# Patient Record
Sex: Female | Born: 1960 | Race: White | Hispanic: No | Marital: Married | Smoking: Never smoker
Health system: Southern US, Community
[De-identification: ages and names within clinical notes are randomized; demographics above are authoritative.]

## PROBLEM LIST (undated history)

## (undated) DIAGNOSIS — E119 Type 2 diabetes mellitus without complications: Secondary | ICD-10-CM

## (undated) DIAGNOSIS — U071 COVID-19: Secondary | ICD-10-CM

## (undated) DIAGNOSIS — I10 Essential (primary) hypertension: Secondary | ICD-10-CM

## (undated) HISTORY — PX: CHOLECYSTECTOMY: SHX55

## (undated) HISTORY — PX: CATARACT EXTRACTION: SUR2

## (undated) HISTORY — PX: DILATION AND CURETTAGE OF UTERUS: SHX78

## (undated) HISTORY — PX: TUBAL LIGATION: SHX77

## (undated) HISTORY — PX: UTERINE FIBROID EMBOLIZATION: SHX825

---

## 2003-12-21 ENCOUNTER — Other Ambulatory Visit: Payer: Self-pay

## 2007-08-12 ENCOUNTER — Emergency Department: Payer: Self-pay | Admitting: Emergency Medicine

## 2008-03-29 ENCOUNTER — Ambulatory Visit: Payer: Self-pay | Admitting: Obstetrics & Gynecology

## 2008-04-01 ENCOUNTER — Ambulatory Visit: Payer: Self-pay | Admitting: Obstetrics & Gynecology

## 2012-08-20 LAB — COMPREHENSIVE METABOLIC PANEL
Anion Gap: 8 (ref 7–16)
Calcium, Total: 9.2 mg/dL (ref 8.5–10.1)
Chloride: 107 mmol/L (ref 98–107)
Co2: 27 mmol/L (ref 21–32)
EGFR (African American): >60
EGFR (Non-African Amer.): >60
Osmolality: 288 (ref 275–301)
Potassium: 4 mmol/L (ref 3.5–5.1)
SGOT(AST): 28 U/L (ref 15–37)
SGPT (ALT): 42 U/L (ref 12–78)
Sodium: 142 mmol/L (ref 136–145)
Total Protein: 7.6 g/dL (ref 6.4–8.2)

## 2012-08-20 LAB — CBC
HCT: 40.9 % (ref 35.0–47.0)
HGB: 13.9 g/dL (ref 12.0–16.0)
MCH: 30.2 pg (ref 26.0–34.0)
MCHC: 33.9 g/dL (ref 32.0–36.0)
MCV: 89 fL (ref 80–100)
Platelet: 213 10*3/uL (ref 150–440)

## 2012-08-20 LAB — LIPASE, BLOOD: Lipase: 123 U/L (ref 73–393)

## 2012-08-21 ENCOUNTER — Observation Stay: Payer: Self-pay | Admitting: Surgery

## 2012-08-21 LAB — URINALYSIS, COMPLETE
Bilirubin,UR: NEGATIVE
Blood: NEGATIVE
Glucose,UR: NEGATIVE mg/dL (ref 0–75)
Ketone: NEGATIVE
Leukocyte Esterase: NEGATIVE
Nitrite: NEGATIVE
Ph: 6 (ref 4.5–8.0)
RBC,UR: NONE SEEN /HPF (ref 0–5)
Specific Gravity: 1.004 (ref 1.003–1.030)
Squamous Epithelial: NONE SEEN
WBC UR: <1 /HPF (ref 0–5)

## 2012-08-21 LAB — PREGNANCY, URINE: Pregnancy Test, Urine: NEGATIVE m[IU]/mL

## 2012-09-08 ENCOUNTER — Other Ambulatory Visit: Payer: Self-pay | Admitting: Surgery

## 2012-09-08 LAB — BASIC METABOLIC PANEL
BUN: 19 mg/dL — ABNORMAL HIGH (ref 7–18)
Chloride: 108 mmol/L — ABNORMAL HIGH (ref 98–107)
Co2: 27 mmol/L (ref 21–32)
Creatinine: 0.92 mg/dL (ref 0.60–1.30)
EGFR (African American): >60
Glucose: 148 mg/dL — ABNORMAL HIGH (ref 65–99)
Osmolality: 284 (ref 275–301)
Sodium: 140 mmol/L (ref 136–145)

## 2012-09-08 LAB — CBC WITH DIFFERENTIAL/PLATELET
Basophil %: 0.8 %
Eosinophil #: 0.1 10*3/uL (ref 0.0–0.7)
Eosinophil %: 2.2 %
HGB: 13.6 g/dL (ref 12.0–16.0)
Lymphocyte %: 34.6 %
MCH: 30.7 pg (ref 26.0–34.0)
MCV: 89 fL (ref 80–100)
Monocyte #: 0.4 x10 3/mm (ref 0.2–0.9)
Monocyte %: 6.6 %
Neutrophil #: 3.2 10*3/uL (ref 1.4–6.5)
Neutrophil %: 55.8 %
RBC: 4.42 10*6/uL (ref 3.80–5.20)
WBC: 5.8 10*3/uL (ref 3.6–11.0)

## 2013-05-21 ENCOUNTER — Ambulatory Visit: Payer: Self-pay | Admitting: Unknown Physician Specialty

## 2015-02-01 NOTE — H&P (Signed)
PATIENT NAME:  Vickie Greer, [name] MR#:  [id number] DATE OF BIRTH:  1961/04/02  DATE OF ADMISSION:  08/21/2012  CHIEF COMPLAINT: Five days of severe abdominal pain.   HISTORY: This is a 54 year old otherwise healthy white female who presents to the Emergency Room with an approximately five day history of progressive right upper quadrant and epigastric abdominal pain associated with mild nausea but no emesis. She describes the pain beginning Friday evening following a bowl of chili ingestion. She does have allergy to seafood which causes similar type abdominal pain which is more centralized in location. Over the course of the weekend, the pain got somewhat better; however, on Monday she had more chili and the resumption of her pain which has been unrelenting over the last two days. She brings herself to the Emergency Room for further evaluation and management. She has had no sick contacts, no fevers, and no jaundice. She is accompanied by her husband. She has had no similar type of pain in the right upper quadrant in the past. She admits to approximately 15 to 20 pound weight loss within the last year. Work-up in the Emergency Room is suggestive of cholelithiasis and as such surgical services were consulted.   ALLERGIES: Shellfish.  MEDICATIONS: Benadryl as needed for insomnia.   PAST MEDICAL HISTORY: Nephrolithiasis.   PAST SURGICAL HISTORY:  1. Removal of kidney stones.  2. Tubal ligation.   SOCIAL HISTORY: She is married and employed as a Midwifekindergarten teacher. She does not smoke, does not drink, and no illicit drug use.   FAMILY HISTORY: Noncontributory.  REVIEW OF SYSTEMS: As described above and as per 10-point review, otherwise unremarkable.   PHYSICAL EXAMINATION:   GENERAL: She is alert and oriented, nontoxic appearing.   VITAL SIGNS: Temperature is 98.7, pulse 105, blood pressure 196/89, and respiratory rate 20 and unlabored. Height 5 feet 5 inches, 213 pounds, and BMI 35.4.   GENERAL:  Affect is normal.   HEENT: Facies are symmetrical. Head is atraumatic and normocephalic. Extraocular muscles appear to be intact. Sclerae are clear with no evidence of icterus.   NECK: Supple. No adenopathy.   PULMONARY: Lungs are clear bilaterally.   CARDIOVASCULAR: Heart regular rate and rhythm.   ABDOMEN: Soft. There is tenderness to deep palpation in the right upper quadrant consistent with a positive Murphy sign. There is less intense tenderness over the epigastrium. Lower quadrants are unremarkable. No obvious hernias. No mass.   EXTREMITIES: Warm and well perfused.   MUSCULOSKELETAL: Grossly normal.   NEURO/PSYCHIATRIC: Alert and oriented x4.   LABS/RADIOLOGIC STUDIES: Glucose 172. Electrolytes unremarkable. Creatinine 0.89, total bilirubin 0.7, alkaline phosphatase 97, AST 28, and ALT 42. Lipase 123. White count 7.9, hemoglobin 13.9, and platelet count 213,000.   Urinalysis is negative.   Urine pregnancy test was obtained and found to be negative. She has had a previous tubal ligation.   Review of ultrasound demonstrates gallstones. There is gallbladder stone stuck within the neck of the gallbladder, both in sitting and supine position. Common bile duct is a little over 7 mm. By my review, there is no pericholecystic fluid or gallbladder wall thickening present.   IMPRESSION: A 54 year old white female with five days of abdominal pain centered in the right upper quadrant and epigastrium consistent with acute calculus cholecystitis.   PLAN: The patient will be admitted, hydrated. Unasyn will be initiated. She will be given narcotics and antiemetics and plan for laparoscopic cholecystectomy once OR availability can be made later this afternoon. I will discuss this  patient and her care with Dr. Michela Pitcher who will assume the service in the morning.  ____________________________ Redge Gainer. Egbert Garibaldi, MD mab:slb D: 08/21/2012 03:39:47 ET T: 08/21/2012 08:57:04 ET JOB#: [id number]  cc: Loraine Leriche A.  Egbert Garibaldi, MD, <Dictator> Laelle Bridgett A Nickie Deren MD ELECTRONICALLY SIGNED 08/24/2012 9:00

## 2015-02-01 NOTE — H&P (Signed)
Subjective/Chief Complaint severe RUQ abdominal pain    History of Present Illness 51 yof presents with 5 day hx of progressive ruq and epigastric abdominal pain, mild nausea. no emesis. Pain began following eating some chili with red peppers.  Abdominal pain got somehwat better then returned monday following more chili.  Describes pain as severe, sharp and unrelenting.  no sick contacts. no jaundice.  Admits to abdominal pain in past following seafood ingestion.    Past History as above    Primary Physician Dr. 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    SOB/DOE No    Chest Pain No    Tolerating Diet No  Nauseated    Medications/Allergies Reviewed Medications/Allergies reviewed   Physical Exam:   GEN no acute distress, 98.1 temp    HEENT pale conjunctivae    NECK supple    RESP normal resp effort  clear BS    CARD regular rate    ABD positive tenderness  positive Flank Tenderness  no liver/spleen enlargement  soft  normal BS  tender ruq, murphy's sign present.    LYMPH negative neck    SKIN normal to palpation    NEURO cranial nerves intact    PSYCH A+O to time, place, person   Lab Results:  Hepatic:  06-Nov-13 23:21    Bilirubin, Total 0.7   Alkaline Phosphatase 97   SGPT (ALT) 42   SGOT (AST) 28   Total Protein, Serum 7.6   Albumin, Serum 4.1  Routine Chem:  06-Nov-13 23:21    Glucose, Serum  172    BUN 15   Creatinine (comp) 0.89   Sodium, Serum 142   Potassium, Serum 4.0   Chloride, Serum 107   CO2, Serum 27   Calcium (Total), Serum 9.2   Osmolality (calc) 288   eGFR (African American) >60   eGFR (Non-African American) >60 (eGFR values <22mL/min/1.73 m2 may be an indication of chronic kidney disease (CKD). Calculated eGFR is useful in patients with stable renal function. The eGFR calculation will not be reliable in acutely ill patients when serum creatinine is changing rapidly. It is not useful in  patients on dialysis. The eGFR calculation may not be applicable to patients at the low and high extremes of body sizes, pregnant women, and vegetarians.)   Anion Gap 8   Lipase 123 (Result(s) reported on 20 Aug 2012 at 11:53PM.)  Routine UA:  06-Nov-13 23:21    Color (UA) Straw   Clarity (UA) Clear   Glucose (UA) Negative   Bilirubin (UA) Negative   Ketones (UA) Negative   Specific Gravity (UA) 1.004   Blood (UA) Negative   pH (UA) 6.0   Protein (UA) Negative   Nitrite (UA) Negative   Leukocyte Esterase (UA) Negative (Result(s) reported on 21 Aug 2012 at 12:25AM.)  RBC (UA) NONE SEEN   WBC (UA) <1 /HPF   Bacteria (UA) NONE SEEN   Epithelial Cells (UA) NONE SEEN  Result(s) reported on 21 Aug 2012 at 12:25AM.  Routine Sero:  06-Nov-13 23:21    Pregnancy Test, Urine NEGATIVE (The results of the qualitative urine HCG (Pregnancy Test) should be evaluated in light of other clinical information.  There are limitations to the test which, in certain clinical situations, may result in a false positive or negative result. Thehigh dose hook effect can occur in urine samples with extremely high HCG concentrations.  This effect can produce a negative result in certain situations. It is suggested that results of the qualitative HCG be confirmed by an alternate methodology, such as the quantitative serum beta HCG test.)  Routine Hem:  06-Nov-13 23:21    WBC (CBC) 7.9   RBC  (CBC) 4.59   Hemoglobin (CBC) 13.9   Hematocrit (CBC) 40.9   Platelet Count (CBC) 213 (Result(s) reported on 20 Aug 2012 at 11:52PM.)   MCV 89   MCH 30.2   MCHC 33.9   RDW 12.9     Assessment/Admission Diagnosis 53 yof with acute cholecystitis with gallstone lodged in GB neck on ultrasound.    Plan Admit, hydrate, IV abx, plan lap chole later today. will discuss with Dr. Pat Patrick in am.   Electronic Signatures: Sherri Rad (MD)  (Signed [phone] 03:33)  Authored: CHIEF COMPLAINT and HISTORY, PAST MEDICAL/SURGIAL HISTORY, ALLERGIES, Other Allergies, HOME MEDICATIONS, OTHER MEDICATIONS, FAMILY AND SOCIAL HISTORY, REVIEW OF SYSTEMS, PHYSICAL EXAM, LABS, ASSESSMENT AND PLAN   Last Updated: 07-Nov-13 03:33 by Sherri Rad (MD)

## 2015-02-01 NOTE — Op Note (Signed)
PATIENT NAME:  Vickie Greer, Keith MR#:  [id number] DATE OF BIRTH:  1961/04/13  DATE OF PROCEDURE:  08/21/2012  PREOPERATIVE DIAGNOSIS: Acute cholecystitis.   POSTOPERATIVE DIAGNOSIS: Acute cholecystitis.   PROCEDURE: Laparoscopic cholecystectomy.  SURGEONKoren Bound:  R. Ely, MD  ANESTHESIA:  General.  OPERATIVE PROCEDURE:  With the patient in the supine position after the induction of appropriate general anesthesia, the patient's abdomen was prepped with ChloraPrep and draped with sterile towels. The patient was placed in the head-down, feet-up position. A small infraumbilical incision was made in the standard fashion, carried down bluntly through the subcutaneous tissue. The Veress needle was used to cannulate the peritoneal cavity. CO2 was insufflated to appropriate pressure measurements. When approximately 2.5 liters of CO2 were instilled, the Veress needle was withdrawn. An 11-mm [employer] port was inserted into the peritoneal cavity. Intraperitoneal position was confirmed. CO2 was re-insufflated. The patient was placed in the head-up, feet-down position and rotated slightly to the left side. A subxiphoid transverse incision was made and an 11-mm port was inserted under direct vision. Two lateral ports 5 mm in size were inserted under direct vision. The gallbladder was significantly distended and inflamed, obviously discolored with acute cholecystitis. It was drained of about 75 mL of light-colored bile using the aspirating needle. The gallbladder was then retracted superiorly and laterally, exposing the hepatoduodenal ligament. The cystic artery and cystic duct were identified. There appeared to be a large obstructing stone in the neck of the gallbladder.  The duct was clipped on the gallbladder side and opened. A cholangiogram could not be performed as the duct could not be cannulated with the small catheter. After multiple attempts, the cholangiography was abandoned. The cystic duct was doubly clipped and  divided. The cystic artery was doubly clipped and divided. The gallbladder was then dissected free from its bed and delivered using hook and cautery apparatus. Once the gallbladder was free, the gallbladder was captured in an Endo Catch apparatus and removed through the epigastric port. The area was copiously suctioned and irrigated. All ports were withdrawn under direct vision. The abdomen was desufflated. The upper midline incision was closed with the suture passer and 0 Vicryl. Skin incisions were closed with 5-0 nylon. Sterile dressings were applied. The area was infiltrated with 0.25% Marcaine for postoperative pain control. The patient was returned to the recovery room having tolerated the procedure well. Sponge, instrument, and needle counts were correct times two in the operating room.    ____________________________ Quentin Orealph L. Ely III, MD rle:bjt D: 08/21/2012 08:50:44 ET T: 08/21/2012 11:48:30 ET JOB#: [id number]  cc: Carmie Endalph L. Ely III, MD, <Dictator> Mickel Crowobert J. Mead, MD Quentin OreALPH L ELY MD ELECTRONICALLY SIGNED 08/28/2012 22:29

## 2015-02-01 NOTE — Discharge Summary (Signed)
PATIENT NAME:  Vickie Greer, Asti MR#:  [id number] DATE OF BIRTH:  03/02/1961  DATE OF ADMISSION:  08/21/2012 DATE OF DISCHARGE:  08/23/2012  BRIEF HISTORY: Vickie Greer is a 54 year old woman seen in the Emergency Room with evidence of significant biliary tract disease. She was admitted for further evaluation of progressive right upper quadrant pain, epigastric pain, nausea and vomiting. She was felt to have possible acute cholecystitis with gallstone lodged in the gallbladder neck on ultrasound. We took her to surgery on the afternoon of 08/21/2012 after informed consent. She underwent laparoscopic cholecystectomy. She had acute cholecystitis. The procedure was largely uncomplicated. She improved over the next 48 hours and was discharged home on the 9th to be followed in the office in 7 to 10 days' time. Vickie Greer, activity, and driving instructions were given to the patient. She is to resume her home medications of Benadryl 25 mg p.o. at bedtime p.r.n. and Vicodin 325/5 mg p.o. q.6 hours p.r.n.   FINAL DISCHARGE DIAGNOSIS: Acute cholecystitis.   SURGERY: Laparoscopic cholecystectomy.   ____________________________ Quentin Orealph L. Ely III, MD rle:drc D: 08/28/2012 22:39:40 ET T: 08/29/2012 10:50:44 ET JOB#: [id number]  cc: Carmie Endalph L. Ely III, MD, <Dictator> Mickel Crowobert J. Mead, MD Quentin OreALPH L ELY MD ELECTRONICALLY SIGNED 09/06/2012 21:29

## 2018-01-01 ENCOUNTER — Encounter: Payer: Self-pay | Admitting: Obstetrics & Gynecology

## 2018-01-02 ENCOUNTER — Encounter: Payer: Self-pay | Admitting: Obstetrics & Gynecology

## 2018-01-02 ENCOUNTER — Ambulatory Visit (INDEPENDENT_AMBULATORY_CARE_PROVIDER_SITE_OTHER): Payer: BC Managed Care – PPO | Admitting: Obstetrics & Gynecology

## 2018-01-02 VITALS — BP 130/82 | HR 77 | Ht 66.0 in | Wt 176.0 lb

## 2018-01-02 DIAGNOSIS — Z0000 Encounter for general adult medical examination without abnormal findings: Secondary | ICD-10-CM

## 2018-01-02 DIAGNOSIS — Z124 Encounter for screening for malignant neoplasm of cervix: Secondary | ICD-10-CM | POA: Diagnosis not present

## 2018-01-02 DIAGNOSIS — Z1231 Encounter for screening mammogram for malignant neoplasm of breast: Secondary | ICD-10-CM

## 2018-01-02 DIAGNOSIS — Z1239 Encounter for other screening for malignant neoplasm of breast: Secondary | ICD-10-CM

## 2018-01-02 DIAGNOSIS — Z1211 Encounter for screening for malignant neoplasm of colon: Secondary | ICD-10-CM | POA: Diagnosis not present

## 2018-01-02 DIAGNOSIS — N952 Postmenopausal atrophic vaginitis: Secondary | ICD-10-CM | POA: Diagnosis not present

## 2018-01-02 MED ORDER — REPLENS VA GEL: 1.0000 "application " | VAGINAL | 11 refills | Status: DC

## 2018-01-02 NOTE — Progress Notes (Signed)
HPI:      Vickie Greer is a 57 y.o. [id number] who LMP was in the past, she presents today for her annual examination.  The patient has c/o vaginal dryness and dyspareunia. The patient is sexually active. Herlast pap: approximate date 2016 and was normal and last mammogram: was normal.  The patient does perform self breast exams.  There is no notable family history of breast or ovarian cancer in her family. The patient is not taking hormone replacement therapy. Patient denies post-menopausal vaginal bleeding.   The patient has regular exercise: yes. The patient denies current symptoms of depression.    GYN Hx: Last Colonoscopy:5 years ago. Polyp removed, requested 5 year follow up.  Last DEXA: never ago.    PMHx: History reviewed. No pertinent past medical history. Past Surgical History:  Procedure Laterality Date  . CHOLECYSTECTOMY    . DILATION AND CURETTAGE OF UTERUS    . TUBAL LIGATION    . UTERINE FIBROID EMBOLIZATION     Family History  Problem Relation Age of Onset  . Breast cancer Mother   . Hypertension Father   . Hypoparathyroidism Sister   . Stomach cancer Paternal Grandmother   . Breast cancer Paternal Aunt   . Breast cancer Paternal Aunt    Social History   Tobacco Use  . Smoking status: Never Smoker  . Smokeless tobacco: Never Used  Substance Use Topics  . Alcohol use: Never    Frequency: Never  . Drug use: Not on file    Current Outpatient Medications:  .  buPROPion (WELLBUTRIN XL) 150 MG 24 hr tablet, Take by mouth., Disp: , Rfl:  .  buPROPion (WELLBUTRIN XL) 150 MG 24 hr tablet, Take 150 mg by mouth daily., Disp: , Rfl: 3 .  Melatonin 10 MG TABS, Take by mouth., Disp: , Rfl:  .  metFORMIN (GLUCOPHAGE) 500 MG tablet, Take 500 mg by mouth 2 (two) times daily with a meal., Disp: , Rfl: 11 Allergies: Shellfish allergy  Review of Systems  Constitutional: Negative for chills, fever and malaise/fatigue.  HENT: Negative for congestion, sinus pain and sore  throat.   Eyes: Negative for blurred vision and pain.  Respiratory: Negative for cough and wheezing.   Cardiovascular: Negative for chest pain and leg swelling.  Gastrointestinal: Negative for abdominal pain, constipation, diarrhea, heartburn, nausea and vomiting.  Genitourinary: Negative for dysuria, frequency, hematuria and urgency.  Musculoskeletal: Negative for back pain, joint pain, myalgias and neck pain.  Skin: Negative for itching and rash.  Neurological: Negative for dizziness, tremors and weakness.  Endo/Heme/Allergies: Does not bruise/bleed easily.  Psychiatric/Behavioral: Negative for depression. The patient is not nervous/anxious and does not have insomnia.     Objective: BP 130/82   Pulse 77   Ht 5\' 6"  (1.676 m)   Wt 176 lb (79.8 kg)   BMI 28.41 kg/m   Filed Weights   01/02/18 0812  Weight: 176 lb (79.8 kg)   Body mass index is 28.41 kg/m. Physical Exam  Constitutional: She is oriented to person, place, and time. She appears well-developed and well-nourished. No distress.  Genitourinary: Rectum normal, vagina normal and uterus normal. Pelvic exam was performed with patient supine. There is no rash or lesion on the right labia. There is no rash or lesion on the left labia. Vagina exhibits no lesion. No bleeding in the vagina. Right adnexum does not display mass and does not display tenderness. Left adnexum does not display mass and does not display tenderness. Cervix does  not exhibit motion tenderness, lesion, friability or polyp.   Uterus is mobile and midaxial. Uterus is not enlarged or exhibiting a mass.  HENT:  Head: Normocephalic and atraumatic. Head is without laceration.  Right Ear: Hearing normal.  Left Ear: Hearing normal.  Nose: No epistaxis.  No foreign bodies.  Mouth/Throat: Uvula is midline, oropharynx is clear and moist and mucous membranes are normal.  Eyes: Pupils are equal, round, and reactive to light.  Neck: Normal range of motion. Neck supple. No  thyromegaly present.  Cardiovascular: Normal rate and regular rhythm. Exam reveals no gallop and no friction rub.  No murmur heard. Pulmonary/Chest: Effort normal and breath sounds normal. No respiratory distress. She has no wheezes. Right breast exhibits no mass, no skin change and no tenderness. Left breast exhibits no mass, no skin change and no tenderness.  Abdominal: Soft. Bowel sounds are normal. She exhibits no distension. There is no tenderness. There is no rebound.  Musculoskeletal: Normal range of motion.  Neurological: She is alert and oriented to person, place, and time. No cranial nerve deficit.  Skin: Skin is warm and dry.  Psychiatric: She has a normal mood and affect. Judgment normal.  Vitals reviewed.  Assessment: Annual Exam 1. Annual physical exam   2. Screening for cervical cancer   3. Screening for breast cancer   4. Screen for colon cancer   5. Vaginal atrophy    Plan:            1.  Cervical Screening-  Pap smear done today  2. Breast screening- Exam annually and mammogram scheduled  3. Colonoscopy every 10 years, Hemoccult testing after age 76  4. Labs managed by PCP  5. Counseling for hormonal therapy: none  6. Vaginal atrophy, dryness, and dyspareunia- counsled on medical options for therapy.  Will start w twice weekly REPLENS.    F/U  Return in about 1 year (around 01/03/2019) for Annual.  Annamarie Major, MD, [facility], [facility] 01/02/2018  8:36 AM

## 2018-01-02 NOTE — Patient Instructions (Addendum)
PAP every three years Mammogram every year    Call [phone] to schedule at [facility] Colonoscopy every 10 years Labs yearly (with PCP)  Atrophic Vaginitis Atrophic vaginitis is a condition in which the tissues that line the vagina become dry and thin. This condition is most common in women who have stopped having regular menstrual periods (menopause). This usually starts when a woman is 45-55 years old. Estrogen helps to keep the vagina moist. It stimulates the vagina to produce a clear fluid that lubricates the vagina for sexual intercourse. This fluid also protects the vagina from infection. Lack of estrogen can cause the lining of the vagina to get thinner and dryer. The vagina may also shrink in size. It may become less elastic. Atrophic vaginitis tends to get worse over time as a woman's estrogen level drops. What are the causes? This condition is caused by the normal drop in estrogen that happens around the time of menopause. What increases the risk? Certain conditions or situations may lower a woman's estrogen level, which increases her risk of atrophic vaginitis. These include:  Taking medicine that blocks estrogen.  Having ovaries removed surgically.  Being treated for cancer with X-ray treatment (radiation) or medicines (chemotherapy).  Exercising very hard and often.  Having an eating disorder (anorexia).  Giving birth or breastfeeding.  Being over the age of 50.  Smoking.  What are the signs or symptoms? Symptoms of this condition include:  Pain, soreness, or bleeding during sexual intercourse (dyspareunia).  Vaginal burning, irritation, or itching.  Pain or bleeding during a vaginal examination using a speculum (pelvic exam).  Loss of interest in sexual activity.  Having burning pain when passing urine.  Vaginal discharge that is brown or yellow.  In some cases, there are no symptoms. How is this diagnosed? This condition is diagnosed with a medical  history and physical exam. This will include a pelvic exam that checks whether the inside of your vagina appears pale, thin, or dry. Rarely, you may also have other tests, including:  A urine test.  A test that checks the acid balance in your vaginal fluid (acid balance test).  How is this treated? Treatment for this condition may depend on the severity of your symptoms. Treatment may include:  Using an over-the-counter vaginal lubricant before you have sexual intercourse.  Using a long-acting vaginal moisturizer - REPLENS  Using low-dose vaginal estrogen for moderate to severe symptoms that do not respond to other treatments. Options include creams, tablets, and inserts (vaginal rings). Before using vaginal estrogen, tell your health care provider if you have a history of: ? Breast cancer. ? Endometrial cancer. ? Blood clots.  Taking medicines. You may be able to take a daily pill for dyspareunia. Discuss all of the risks of this medicine with your health care provider. It is usually not recommended for women who have a family history or personal history of breast cancer.  If your symptoms are very mild and you are not sexually active, you may not need treatment. Follow these instructions at home:  Take medicines only as directed by your health care provider. Do not use herbal or alternative medicines unless your health care provider says that you can.  Use over-the-counter creams, lubricants, or moisturizers for dryness only as directed by your health care provider.  If your atrophic vaginitis is caused by menopause, discuss all of your menopausal symptoms and treatment options with your health care provider.  Do not douche.  Do not use products that can   make your vagina dry. These include: ? Scented feminine sprays. ? Scented tampons. ? Scented soaps.  If it hurts to have sex, talk with your sexual partner. Contact a health care provider if:  Your discharge looks different  than normal.  Your vagina has an unusual smell.  You have new symptoms.  Your symptoms do not improve with treatment.  Your symptoms get worse. This information is not intended to replace advice given to you by your health care provider. Make sure you discuss any questions you have with your health care provider. Document Released: 02/15/2015 Document Revised: 03/08/2016 Document Reviewed: 09/22/2014 Elsevier Interactive Patient Education  2018 [employer]  

## 2018-01-06 LAB — IGP, APTIMA HPV
HPV Aptima: NEGATIVE
PAP Smear Comment: 0

## 2018-01-20 ENCOUNTER — Encounter: Payer: Self-pay | Admitting: Radiology

## 2018-01-20 ENCOUNTER — Ambulatory Visit
Admission: RE | Admit: 2018-01-20 | Discharge: 2018-01-20 | Disposition: A | Discharge status: Home / Self Care | Payer: BC Managed Care – PPO | Source: Ambulatory Visit | Attending: Obstetrics & Gynecology | Admitting: Obstetrics & Gynecology

## 2018-01-20 DIAGNOSIS — Z1231 Encounter for screening mammogram for malignant neoplasm of breast: Secondary | ICD-10-CM | POA: Diagnosis not present

## 2018-01-20 DIAGNOSIS — Z1239 Encounter for other screening for malignant neoplasm of breast: Secondary | ICD-10-CM

## 2018-01-28 ENCOUNTER — Other Ambulatory Visit: Payer: Self-pay | Admitting: *Deleted

## 2018-01-28 ENCOUNTER — Inpatient Hospital Stay
Admission: RE | Admit: 2018-01-28 | Discharge: 2018-01-28 | Disposition: A | Discharge status: Home / Self Care | Payer: Self-pay | Source: Ambulatory Visit | Attending: *Deleted | Admitting: *Deleted

## 2018-01-28 DIAGNOSIS — Z9289 Personal history of other medical treatment: Secondary | ICD-10-CM

## 2018-01-30 ENCOUNTER — Encounter: Payer: Self-pay | Admitting: Obstetrics & Gynecology

## 2018-04-04 ENCOUNTER — Ambulatory Visit (INDEPENDENT_AMBULATORY_CARE_PROVIDER_SITE_OTHER): Payer: BC Managed Care – PPO | Admitting: Obstetrics & Gynecology

## 2018-04-04 ENCOUNTER — Encounter: Payer: Self-pay | Admitting: Obstetrics & Gynecology

## 2018-04-04 VITALS — BP 120/80 | Ht 66.0 in | Wt 181.0 lb

## 2018-04-04 DIAGNOSIS — N952 Postmenopausal atrophic vaginitis: Secondary | ICD-10-CM

## 2018-04-04 NOTE — Progress Notes (Signed)
  History of Present Illness:  Vickie Greer is a 57 y.o. who was started on  . Vaginal Lubricant (REPLENS) GEL Place 1 application vaginally 2 (two) times a week.   approximately 3 months ago. Since that time, she states that her symptoms are improving.  Improved dryness and less dyspareunia.  PMHx: She  has no past medical history on file. Also,  has a past surgical history that includes Cholecystectomy; Dilation and curettage of uterus; Tubal ligation; and Uterine fibroid embolization., family history includes Breast cancer (age of onset: 7860) in her mother and paternal aunt; Breast cancer (age of onset: 2970) in her paternal aunt; Hypertension in her father; Hypoparathyroidism in her sister; Stomach cancer in her paternal grandmother.,  reports that she has never smoked. She has never used smokeless tobacco. She reports that she does not drink alcohol. Current Meds  Medication Sig  . buPROPion (WELLBUTRIN XL) 150 MG 24 hr tablet Take by mouth.  . Vaginal Lubricant (REPLENS) GEL Place 1 application vaginally 2 (two) times a week.  . Also, is allergic to shellfish allergy..  Review of Systems  All other systems reviewed and are negative.   Physical Exam:  BP 120/80   Ht 5\' 6"  (1.676 m)   Wt 181 lb (82.1 kg)   BMI 29.21 kg/m  Body mass index is 29.21 kg/m. Constitutional: Well nourished, well developed female in no acute distress.  Abdomen: diffusely non tender to palpation, non distended, and no masses, hernias Neuro: Grossly intact Psych:  Normal mood and affect.    Assessment:  Problem List Items Addressed This Visit      Genitourinary   Vaginal atrophy - Primary     Medication treatment is going very well for her vag atrophy.  Plan: She will undergo no change in her medical therapy. Rx therapy options discussed w patient as next options.  She was amenable to this plan and we will see her back for annual/PRN.  A total of 15 minutes were spent face-to-face with the patient  during this encounter and over half of that time dealt with counseling and coordination of care.  Annamarie MajorPaul Sion Reinders, MD, Merlinda FrederickFACOG [facility], [facility] 04/04/2018  9:15 AM

## 2018-06-15 ENCOUNTER — Encounter: Payer: Self-pay | Admitting: *Deleted

## 2018-06-15 ENCOUNTER — Emergency Department
Admission: EM | Admit: 2018-06-15 | Discharge: 2018-06-15 | Disposition: A | Discharge status: Home / Self Care | Payer: BC Managed Care – PPO | Attending: Emergency Medicine | Admitting: Emergency Medicine

## 2018-06-15 ENCOUNTER — Emergency Department: Payer: BC Managed Care – PPO

## 2018-06-15 ENCOUNTER — Other Ambulatory Visit: Payer: Self-pay

## 2018-06-15 DIAGNOSIS — S6992XA Unspecified injury of left wrist, hand and finger(s), initial encounter: Secondary | ICD-10-CM | POA: Diagnosis present

## 2018-06-15 DIAGNOSIS — Y999 Unspecified external cause status: Secondary | ICD-10-CM | POA: Diagnosis not present

## 2018-06-15 DIAGNOSIS — S60012A Contusion of left thumb without damage to nail, initial encounter: Secondary | ICD-10-CM | POA: Insufficient documentation

## 2018-06-15 DIAGNOSIS — Z79899 Other long term (current) drug therapy: Secondary | ICD-10-CM | POA: Diagnosis not present

## 2018-06-15 DIAGNOSIS — W208XXA Other cause of strike by thrown, projected or falling object, initial encounter: Secondary | ICD-10-CM | POA: Diagnosis not present

## 2018-06-15 DIAGNOSIS — Y929 Unspecified place or not applicable: Secondary | ICD-10-CM | POA: Diagnosis not present

## 2018-06-15 DIAGNOSIS — Y939 Activity, unspecified: Secondary | ICD-10-CM | POA: Insufficient documentation

## 2018-06-15 DIAGNOSIS — T148XXA Other injury of unspecified body region, initial encounter: Secondary | ICD-10-CM

## 2018-06-15 MED ORDER — IBUPROFEN 600 MG PO TABS: 600.0000 mg | ORAL_TABLET | Freq: Four times a day (QID) | ORAL | 0 refills | Status: DC | PRN

## 2018-06-15 NOTE — ED Triage Notes (Signed)
Patient c/o left thumb pain. Patient states she injured the thumb last night. Patient c/o loss of motion and swelling.

## 2018-06-15 NOTE — ED Notes (Signed)
X-ray at bedside

## 2018-06-15 NOTE — ED Provider Notes (Addendum)
[facility] Emergency Department Provider Note  ____________________________________________  Time seen: Approximately 3:25 PM  I have reviewed the triage vital signs and the nursing notes.   HISTORY  Chief Complaint Finger Injury    HPI Vickie Greer is a 57 y.o. female presents emergency department for evaluation of left thumb pain since yesterday.  Patient had a heavy object hit the back of her thumb last night.  It has been painful to bend since.  No alleviating measures have been tried.  She does have a thumb splint that she can use.   History reviewed. No pertinent past medical history.  Patient Active Problem List   Diagnosis Date Noted  . Annual physical exam 01/02/2018  . Vaginal atrophy 01/02/2018    Past Surgical History:  Procedure Laterality Date  . CATARACT EXTRACTION Right   . CHOLECYSTECTOMY    . DILATION AND CURETTAGE OF UTERUS    . TUBAL LIGATION    . UTERINE FIBROID EMBOLIZATION      Prior to Admission medications   Medication Sig Start Date End Date Taking? Authorizing Provider  buPROPion (WELLBUTRIN XL) 150 MG 24 hr tablet Take by mouth. 08/24/17 08/24/18  [provider]  buPROPion (WELLBUTRIN XL) 150 MG 24 hr tablet Take 150 mg by mouth daily. 12/12/17   [provider]  ibuprofen (ADVIL,MOTRIN) 600 MG tablet Take 1 tablet (600 mg total) by mouth every 6 (six) hours as needed. 06/15/18   Enid Derry, PA-C  Melatonin 10 MG TABS Take by mouth.    [provider]  metFORMIN (GLUCOPHAGE) 500 MG tablet Take 500 mg by mouth 2 (two) times daily with a meal. 11/11/17   [provider]  Vaginal Lubricant (REPLENS) GEL Place 1 application vaginally 2 (two) times a week. 01/02/18   Nadara Mustard, MD    Allergies Shellfish allergy  Family History  Problem Relation Age of Onset  . Breast cancer Mother 46  . Hypertension Father   . Hypoparathyroidism Sister   . Stomach cancer Paternal  Grandmother   . Breast cancer Paternal Aunt 53  . Breast cancer Paternal Aunt 54    Social History Social History   Tobacco Use  . Smoking status: Never Smoker  . Smokeless tobacco: Never Used  Substance Use Topics  . Alcohol use: Never    Frequency: Never  . Drug use: Not on file     Review of Systems  Constitutional: No fever/chills Gastrointestinal: No nausea, no vomiting.  Musculoskeletal: Positive for thumb pain. Skin: Negative for rash, abrasions, lacerations.  Positive for ecchymosis.   ____________________________________________   PHYSICAL EXAM:  VITAL SIGNS: ED Triage Vitals  Enc Vitals Group     BP 06/15/18 1415 137/73     Pulse Rate 06/15/18 1415 92     Resp 06/15/18 1415 18     Temp 06/15/18 1415 98.4 F (36.9 C)     Temp Source 06/15/18 1415 Oral     SpO2 06/15/18 1415 95 %     Weight 06/15/18 1410 180 lb (81.6 kg)     Height 06/15/18 1410 5\' 6"  (1.676 m)     Head Circumference --      Peak Flow --      Pain Score 06/15/18 1410 3     Pain Loc --      Pain Edu? --      Excl. in GC? --      Constitutional: Alert and oriented. Well appearing and in no acute distress.  Eyes: Conjunctivae are normal. PERRL. EOMI. Head: Atraumatic. ENT:      Ears:      Nose: No congestion/rhinnorhea.      Mouth/Throat: Mucous membranes are moist.  Neck: No stridor.  Cardiovascular: Normal rate, regular rhythm.  Good peripheral circulation. Respiratory: Normal respiratory effort without tachypnea or retractions. Lungs CTAB. Good air entry to the bases with no decreased or absent breath sounds. Musculoskeletal: Full range of motion to all extremities. No gross deformities appreciated.  Mild tenderness to palpation over dorsal thumb IP joint with very minimal area of ecchymosis.  No swelling. Neurologic:  Normal speech and language. No gross focal neurologic deficits are appreciated.  Skin:  Skin is warm, dry and intact. No rash noted. Psychiatric: Mood and affect  are normal. Speech and behavior are normal. Patient exhibits appropriate insight and judgement.   ____________________________________________   LABS (all labs ordered are listed, but only abnormal results are displayed)  Labs Reviewed - No data to display ____________________________________________  EKG   ____________________________________________  RADIOLOGY Lexine Baton, personally viewed and evaluated these images (plain radiographs) as part of my medical decision making, as well as reviewing the written report by the radiologist.  Dg Finger Thumb Left  Result Date: 06/15/2018 CLINICAL DATA:  Posttraumatic left thumb pain.  Initial encounter. EXAM: LEFT THUMB 2+V COMPARISON:  None. FINDINGS: There is no evidence of fracture or dislocation. There is no evidence of arthropathy or other focal bone abnormality. Soft tissues are unremarkable IMPRESSION: Negative. Electronically Signed   By: Marnee Spring M.D.   On: 06/15/2018 15:17    ____________________________________________    PROCEDURES  Procedure(s) performed:    Procedures    Medications - No data to display   ____________________________________________   INITIAL IMPRESSION / ASSESSMENT AND PLAN / ED COURSE  Pertinent labs & imaging results that were available during my care of the patient were reviewed by me and considered in my medical decision making (see chart for details).  Review of the [place] CSRS was performed in accordance of the [facility] prior to dispensing any controlled drugs.   Patient's diagnosis is consistent with bruise.  X-ray negative for acute abnormalities.  Patient has a thumb splint that she will wear.  Patient will be discharged home with prescriptions for ibuprofen. Patient is to follow up with PCP as directed. Patient is given ED precautions to return to the ED for any worsening or new symptoms.     ____________________________________________  FINAL CLINICAL IMPRESSION(S) / ED  DIAGNOSES  Final diagnoses:  Bruise      NEW MEDICATIONS STARTED DURING THIS VISIT:  ED Discharge Orders         Ordered    ibuprofen (ADVIL,MOTRIN) 600 MG tablet  Every 6 hours PRN     06/15/18 1535              This chart was dictated using voice recognition software/Dragon. Despite best efforts to proofread, errors can occur which can change the meaning. Any change was purely unintentional.    Enid Derry, PA-C 06/15/18 1613    Enid Derry, PA-C 06/15/18 1613    Arnaldo Natal, MD 06/23/18 0030

## 2018-12-18 IMAGING — MG MM DIGITAL SCREENING BILAT W/ TOMO W/ CAD
8 of 12 series · 8 of 28 positions shown · non-contrast
Comparison: Previous exam(s).

CLINICAL DATA: Screening.

EXAM:
DIGITAL SCREENING BILATERAL MAMMOGRAM WITH TOMO AND CAD

[L MLO]
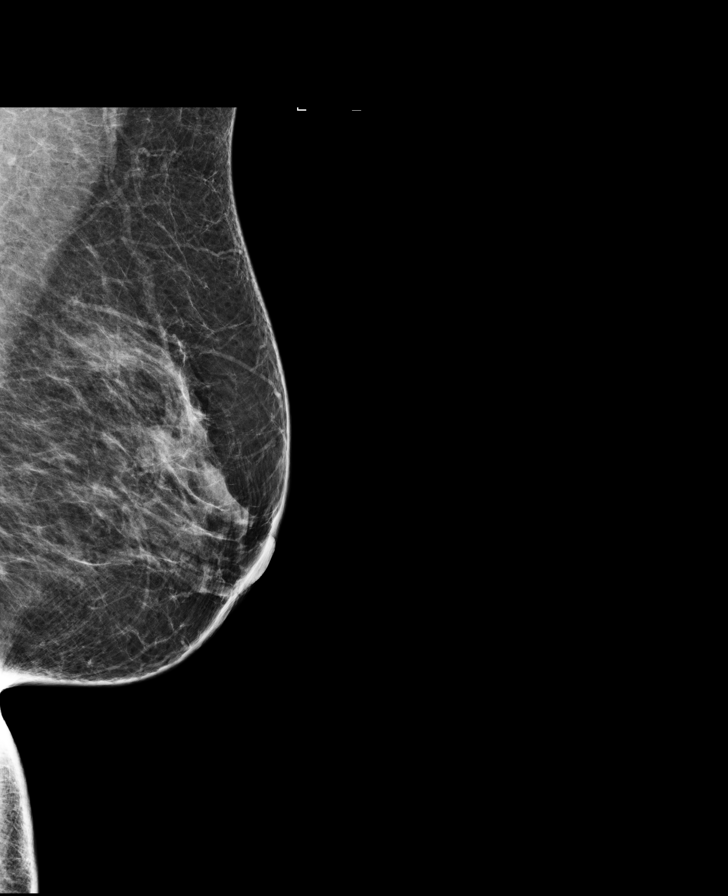

[R MLO]
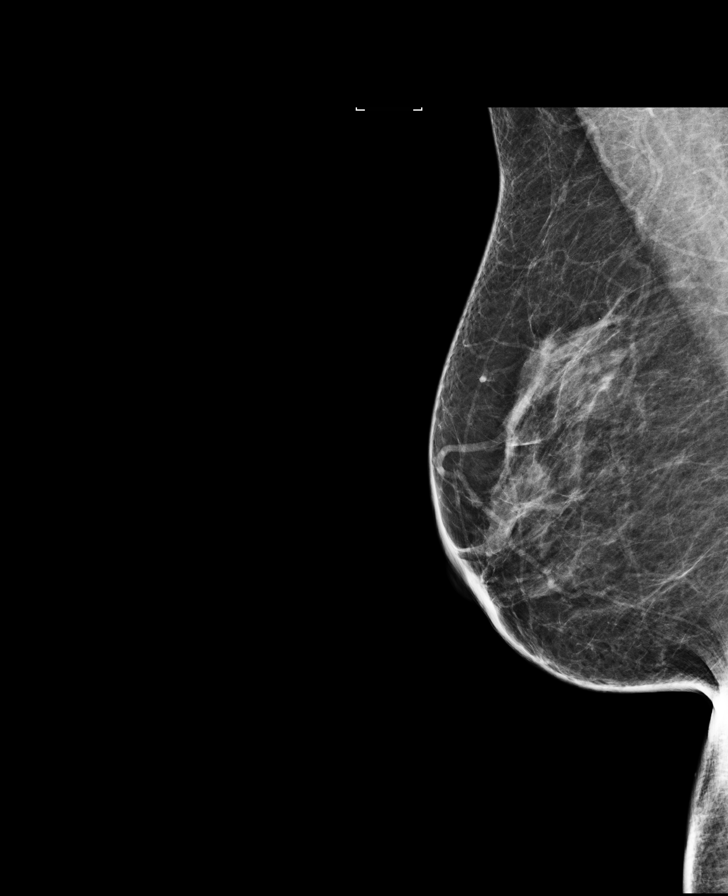

[L MLO synth-2D]
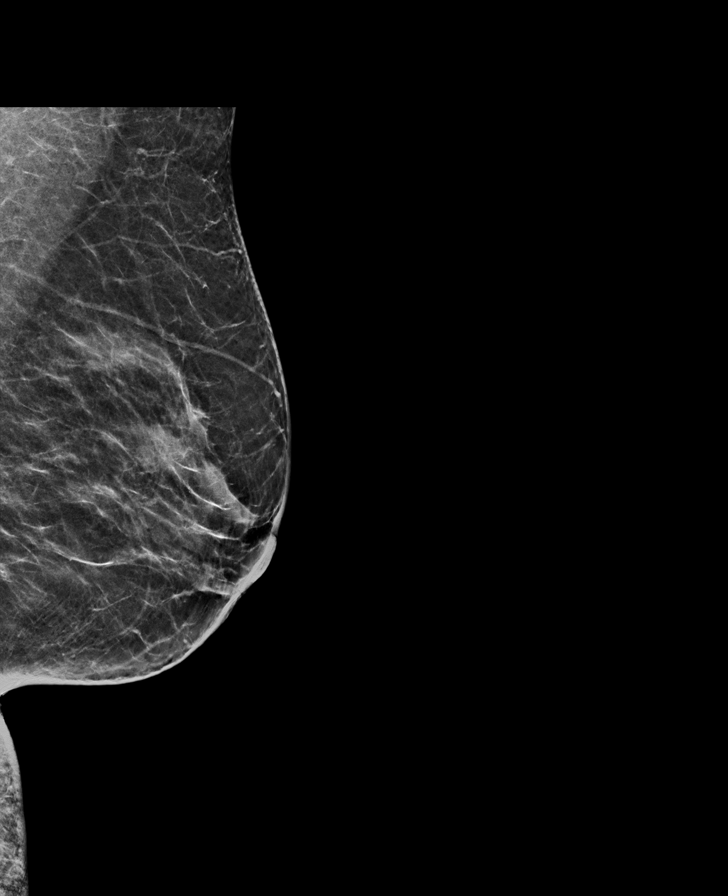

[R CC synth-2D]
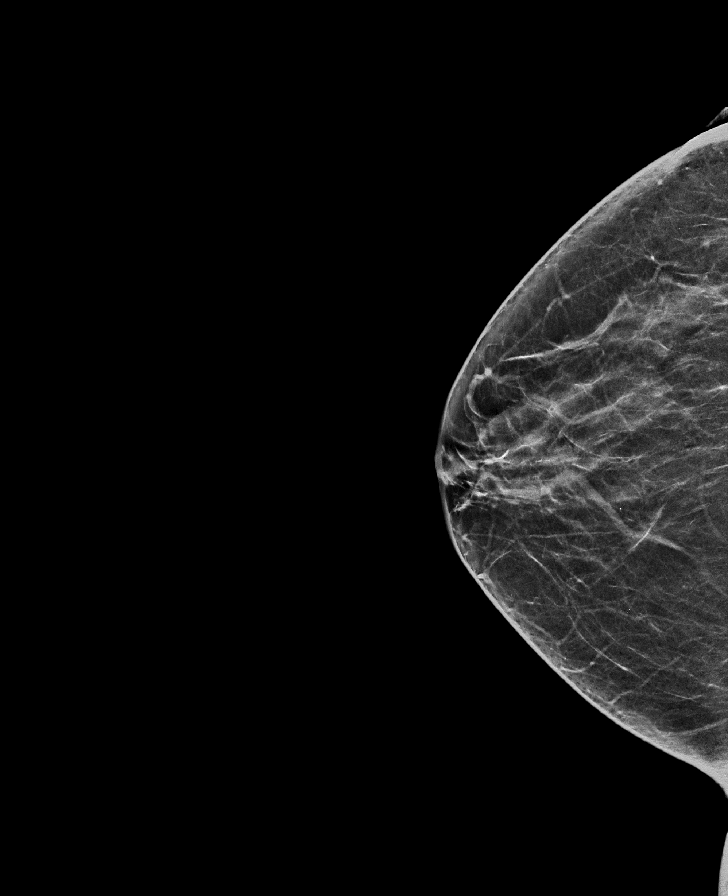

[R CC]
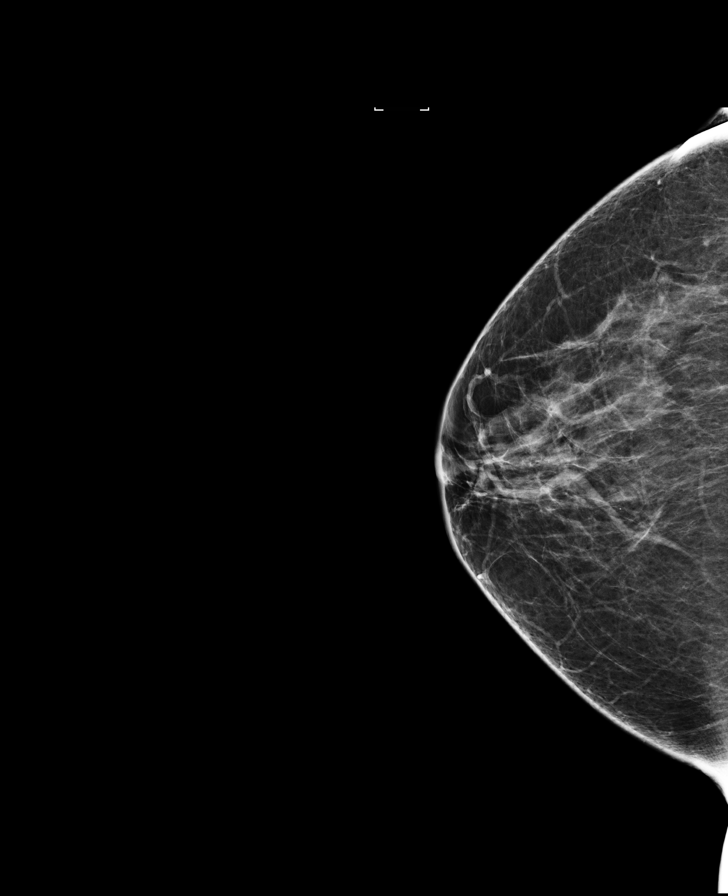

[R MLO synth-2D]
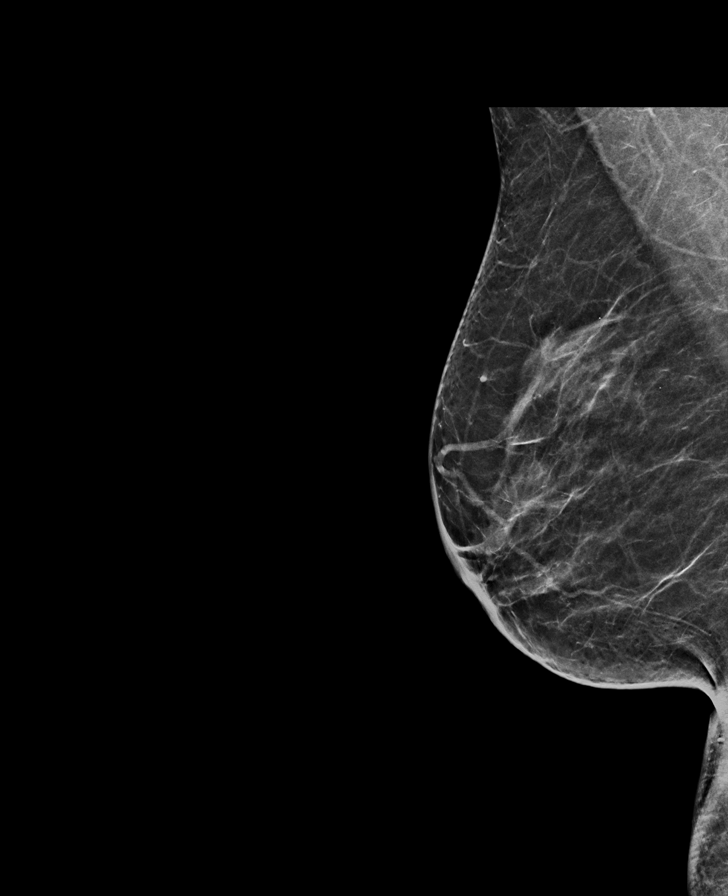

[L CC]
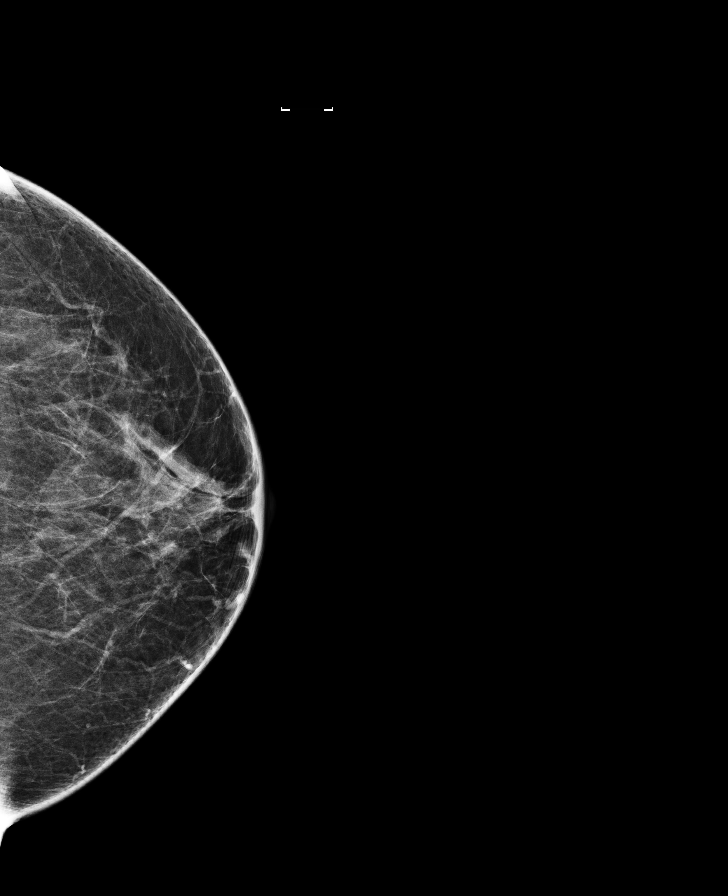

[L CC synth-2D]
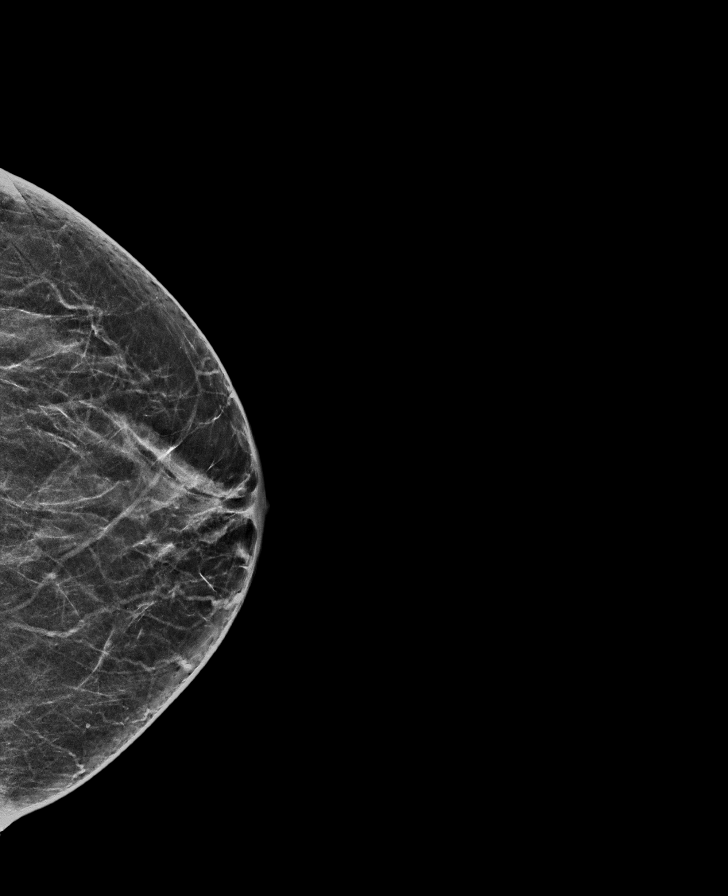

[8 of 28 positions shown; findings below may reference images not displayed]

ACR Breast Density Category c: The breast tissue is heterogeneously
dense, which may obscure small masses.
FINDINGS: There are no findings suspicious for malignancy. Images were
processed with CAD.
IMPRESSION: No mammographic evidence of malignancy. A result letter of this
screening mammogram will be mailed directly to the patient.

RECOMMENDATION:
Screening mammogram in one year. (Code:[id number])

BI-RADS CATEGORY  1: Negative.

## 2019-09-21 ENCOUNTER — Other Ambulatory Visit: Payer: Self-pay | Admitting: Internal Medicine

## 2019-09-21 DIAGNOSIS — Z1231 Encounter for screening mammogram for malignant neoplasm of breast: Secondary | ICD-10-CM

## 2019-09-29 ENCOUNTER — Ambulatory Visit
Admission: RE | Admit: 2019-09-29 | Discharge: 2019-09-29 | Disposition: A | Discharge status: Home / Self Care | Payer: BC Managed Care – PPO | Source: Ambulatory Visit | Attending: Internal Medicine | Admitting: Internal Medicine

## 2019-09-29 DIAGNOSIS — Z1231 Encounter for screening mammogram for malignant neoplasm of breast: Secondary | ICD-10-CM | POA: Diagnosis present

## 2020-08-08 ENCOUNTER — Other Ambulatory Visit: Payer: Self-pay | Admitting: Internal Medicine

## 2020-08-08 DIAGNOSIS — Z1231 Encounter for screening mammogram for malignant neoplasm of breast: Secondary | ICD-10-CM

## 2020-10-03 ENCOUNTER — Other Ambulatory Visit: Payer: Self-pay

## 2020-10-03 ENCOUNTER — Ambulatory Visit
Admission: RE | Admit: 2020-10-03 | Discharge: 2020-10-03 | Disposition: A | Discharge status: Home / Self Care | Payer: BC Managed Care – PPO | Source: Ambulatory Visit | Attending: Internal Medicine | Admitting: Internal Medicine

## 2020-10-03 DIAGNOSIS — Z1231 Encounter for screening mammogram for malignant neoplasm of breast: Secondary | ICD-10-CM | POA: Insufficient documentation

## 2021-05-11 ENCOUNTER — Other Ambulatory Visit: Payer: Self-pay | Admitting: Family Medicine

## 2021-05-11 DIAGNOSIS — R140 Abdominal distension (gaseous): Secondary | ICD-10-CM

## 2021-05-11 DIAGNOSIS — R1031 Right lower quadrant pain: Secondary | ICD-10-CM

## 2021-05-25 ENCOUNTER — Other Ambulatory Visit: Payer: Self-pay

## 2021-05-25 ENCOUNTER — Ambulatory Visit
Admission: RE | Admit: 2021-05-25 | Discharge: 2021-05-25 | Disposition: A | Discharge status: Home / Self Care | Payer: BC Managed Care – PPO | Source: Ambulatory Visit | Attending: Family Medicine | Admitting: Family Medicine

## 2021-05-25 DIAGNOSIS — R1032 Left lower quadrant pain: Secondary | ICD-10-CM | POA: Insufficient documentation

## 2021-05-25 DIAGNOSIS — R140 Abdominal distension (gaseous): Secondary | ICD-10-CM | POA: Insufficient documentation

## 2021-05-25 DIAGNOSIS — R1031 Right lower quadrant pain: Secondary | ICD-10-CM | POA: Diagnosis not present

## 2021-06-04 ENCOUNTER — Observation Stay: Payer: BC Managed Care – PPO | Admitting: Anesthesiology

## 2021-06-04 ENCOUNTER — Encounter: Payer: Self-pay | Admitting: Intensive Care

## 2021-06-04 ENCOUNTER — Other Ambulatory Visit: Payer: Self-pay

## 2021-06-04 ENCOUNTER — Observation Stay
Admission: EM | Admit: 2021-06-04 | Discharge: 2021-06-05 | Disposition: A | Discharge status: Home / Self Care | Payer: BC Managed Care – PPO | Attending: General Surgery | Admitting: General Surgery

## 2021-06-04 ENCOUNTER — Emergency Department: Payer: BC Managed Care – PPO

## 2021-06-04 ENCOUNTER — Encounter
Admission: EM | Disposition: A | Discharge status: Home / Self Care | Payer: Self-pay | Source: Home / Self Care | Attending: Emergency Medicine

## 2021-06-04 DIAGNOSIS — E119 Type 2 diabetes mellitus without complications: Secondary | ICD-10-CM | POA: Insufficient documentation

## 2021-06-04 DIAGNOSIS — Z20822 Contact with and (suspected) exposure to covid-19: Secondary | ICD-10-CM | POA: Diagnosis not present

## 2021-06-04 DIAGNOSIS — Z79899 Other long term (current) drug therapy: Secondary | ICD-10-CM | POA: Diagnosis not present

## 2021-06-04 DIAGNOSIS — I10 Essential (primary) hypertension: Secondary | ICD-10-CM | POA: Diagnosis not present

## 2021-06-04 DIAGNOSIS — K3580 Unspecified acute appendicitis: Principal | ICD-10-CM | POA: Diagnosis present

## 2021-06-04 DIAGNOSIS — Z7984 Long term (current) use of oral hypoglycemic drugs: Secondary | ICD-10-CM | POA: Insufficient documentation

## 2021-06-04 DIAGNOSIS — R1031 Right lower quadrant pain: Secondary | ICD-10-CM | POA: Diagnosis present

## 2021-06-04 HISTORY — PX: LAPAROSCOPIC APPENDECTOMY: SHX408

## 2021-06-04 HISTORY — DX: Essential (primary) hypertension: I10

## 2021-06-04 HISTORY — DX: Type 2 diabetes mellitus without complications: E11.9

## 2021-06-04 LAB — URINALYSIS, COMPLETE (UACMP) WITH MICROSCOPIC
Bilirubin Urine: NEGATIVE
Glucose, UA: NEGATIVE mg/dL
Hgb urine dipstick: NEGATIVE
Ketones, ur: 40 mg/dL — AB
Leukocytes,Ua: NEGATIVE
Nitrite: NEGATIVE
Protein, ur: NEGATIVE mg/dL
Specific Gravity, Urine: >1.03 — ABNORMAL HIGH (ref 1.005–1.030)
pH: 5.5 (ref 5.0–8.0)

## 2021-06-04 LAB — COMPREHENSIVE METABOLIC PANEL
ALT: 25 U/L (ref 0–44)
AST: 19 U/L (ref 15–41)
Albumin: 4.2 g/dL (ref 3.5–5.0)
Alkaline Phosphatase: 63 U/L (ref 38–126)
Anion gap: 14 (ref 5–15)
BUN: 13 mg/dL (ref 6–20)
CO2: 26 mmol/L (ref 22–32)
Calcium: 9.5 mg/dL (ref 8.9–10.3)
Chloride: 100 mmol/L (ref 98–111)
Creatinine, Ser: 0.86 mg/dL (ref 0.44–1.00)
GFR, Estimated: >60 mL/min (ref >60)
Glucose, Bld: 179 mg/dL — ABNORMAL HIGH (ref 70–99)
Potassium: 4 mmol/L (ref 3.5–5.1)
Sodium: 140 mmol/L (ref 135–145)
Total Bilirubin: 2.2 mg/dL — ABNORMAL HIGH (ref 0.3–1.2)
Total Protein: 7.3 g/dL (ref 6.5–8.1)

## 2021-06-04 LAB — CBC
HCT: 42.5 % (ref 36.0–46.0)
Hemoglobin: 14.9 g/dL (ref 12.0–15.0)
MCH: 31.3 pg (ref 26.0–34.0)
MCHC: 35.1 g/dL (ref 30.0–36.0)
MCV: 89.3 fL (ref 80.0–100.0)
Platelets: 204 10*3/uL (ref 150–400)
RBC: 4.76 MIL/uL (ref 3.87–5.11)
RDW: 12.8 % (ref 11.5–15.5)
WBC: 13.3 10*3/uL — ABNORMAL HIGH (ref 4.0–10.5)
nRBC: 0 % (ref 0.0–0.2)

## 2021-06-04 LAB — CBG MONITORING, ED: Glucose-Capillary: 127 mg/dL — ABNORMAL HIGH (ref 70–99)

## 2021-06-04 LAB — GLUCOSE, CAPILLARY: Glucose-Capillary: 220 mg/dL — ABNORMAL HIGH (ref 70–99)

## 2021-06-04 LAB — RESP PANEL BY RT-PCR (FLU A&B, COVID) ARPGX2
Influenza A by PCR: NEGATIVE
Influenza B by PCR: NEGATIVE
SARS Coronavirus 2 by RT PCR: NEGATIVE

## 2021-06-04 LAB — LIPASE, BLOOD: Lipase: 25 U/L (ref 11–51)

## 2021-06-04 SURGERY — APPENDECTOMY, LAPAROSCOPIC
Anesthesia: General | Site: Abdomen

## 2021-06-04 MED ORDER — ACETAMINOPHEN 500 MG PO TABS
1000.0000 mg | ORAL_TABLET | Freq: Four times a day (QID) | ORAL | Status: DC
  Administered 2021-06-04 – 2021-06-05 (×4): 1000 mg via ORAL
  Filled 2021-06-04 (×4): qty 2

## 2021-06-04 MED ORDER — PROMETHAZINE HCL 25 MG/ML IJ SOLN
6.2500 mg | INTRAMUSCULAR | Status: DC | PRN
Start: 2021-06-04 — End: 2021-06-04

## 2021-06-04 MED ORDER — SODIUM CHLORIDE 0.9 % IV SOLN
1.0000 g | Freq: Once | INTRAVENOUS | Status: AC
  Administered 2021-06-04: 1 g via INTRAVENOUS
  Filled 2021-06-04: qty 10

## 2021-06-04 MED ORDER — SUGAMMADEX SODIUM 500 MG/5ML IV SOLN
INTRAVENOUS | Status: AC
  Filled 2021-06-04: qty 5

## 2021-06-04 MED ORDER — LIDOCAINE-EPINEPHRINE 1 %-1:100000 IJ SOLN
INTRAMUSCULAR | Status: DC | PRN
  Administered 2021-06-04: 15 mL

## 2021-06-04 MED ORDER — INSULIN ASPART 100 UNIT/ML IJ SOLN: 0.0000 [IU] | Freq: Three times a day (TID) | INTRAMUSCULAR | Status: DC

## 2021-06-04 MED ORDER — LACTATED RINGERS IV SOLN: INTRAVENOUS | Status: DC | PRN

## 2021-06-04 MED ORDER — OXYCODONE HCL 5 MG/5ML PO SOLN: 5.0000 mg | Freq: Once | ORAL | Status: AC | PRN

## 2021-06-04 MED ORDER — KETOROLAC TROMETHAMINE 30 MG/ML IJ SOLN: 30.0000 mg | Freq: Four times a day (QID) | INTRAMUSCULAR | Status: DC | PRN

## 2021-06-04 MED ORDER — PROPOFOL 10 MG/ML IV BOLUS
INTRAVENOUS | Status: AC
  Filled 2021-06-04: qty 20

## 2021-06-04 MED ORDER — OXYCODONE HCL 5 MG PO TABS
ORAL_TABLET | ORAL | Status: AC
  Filled 2021-06-04: qty 1

## 2021-06-04 MED ORDER — PHENYLEPHRINE HCL (PRESSORS) 10 MG/ML IV SOLN
INTRAVENOUS | Status: AC
  Filled 2021-06-04: qty 1

## 2021-06-04 MED ORDER — ACETAMINOPHEN 10 MG/ML IV SOLN: 1000.0000 mg | Freq: Once | INTRAVENOUS | Status: DC | PRN

## 2021-06-04 MED ORDER — KETOROLAC TROMETHAMINE 30 MG/ML IJ SOLN
INTRAMUSCULAR | Status: DC | PRN
  Administered 2021-06-04: 15 mg via INTRAVENOUS

## 2021-06-04 MED ORDER — MIDAZOLAM HCL 2 MG/2ML IJ SOLN
INTRAMUSCULAR | Status: DC | PRN
  Administered 2021-06-04: 1 mg via INTRAVENOUS

## 2021-06-04 MED ORDER — FENTANYL CITRATE (PF) 100 MCG/2ML IJ SOLN
50.0000 ug | Freq: Once | INTRAMUSCULAR | Status: AC
  Administered 2021-06-04: 50 ug via INTRAVENOUS
  Filled 2021-06-04: qty 2

## 2021-06-04 MED ORDER — EPHEDRINE SULFATE 50 MG/ML IJ SOLN
INTRAMUSCULAR | Status: DC | PRN
  Administered 2021-06-04 (×2): 5 mg via INTRAVENOUS
  Administered 2021-06-04: 10 mg via INTRAVENOUS
  Administered 2021-06-04: 5 mg via INTRAVENOUS

## 2021-06-04 MED ORDER — FENTANYL CITRATE (PF) 100 MCG/2ML IJ SOLN
INTRAMUSCULAR | Status: DC | PRN
  Administered 2021-06-04: 50 ug via INTRAVENOUS
  Administered 2021-06-04: 25 ug via INTRAVENOUS

## 2021-06-04 MED ORDER — LIDOCAINE HCL (PF) 2 % IJ SOLN
INTRAMUSCULAR | Status: AC
  Filled 2021-06-04: qty 5

## 2021-06-04 MED ORDER — MIDAZOLAM HCL 2 MG/2ML IJ SOLN
INTRAMUSCULAR | Status: AC
  Filled 2021-06-04: qty 2

## 2021-06-04 MED ORDER — ONDANSETRON HCL 4 MG/2ML IJ SOLN
INTRAMUSCULAR | Status: DC | PRN
  Administered 2021-06-04: 4 mg via INTRAVENOUS

## 2021-06-04 MED ORDER — FENTANYL CITRATE (PF) 100 MCG/2ML IJ SOLN
25.0000 ug | INTRAMUSCULAR | Status: DC | PRN
  Administered 2021-06-04: 25 ug via INTRAVENOUS

## 2021-06-04 MED ORDER — DEXTROSE IN LACTATED RINGERS 5 % IV SOLN: INTRAVENOUS | Status: DC

## 2021-06-04 MED ORDER — SODIUM CHLORIDE 0.9 % IV BOLUS
1000.0000 mL | Freq: Once | INTRAVENOUS | Status: AC
  Administered 2021-06-04: 1000 mL via INTRAVENOUS

## 2021-06-04 MED ORDER — METRONIDAZOLE 500 MG/100ML IV SOLN
500.0000 mg | Freq: Three times a day (TID) | INTRAVENOUS | Status: DC
  Filled 2021-06-04 (×3): qty 100

## 2021-06-04 MED ORDER — PROPOFOL 10 MG/ML IV BOLUS
INTRAVENOUS | Status: DC | PRN
  Administered 2021-06-04: 150 mg via INTRAVENOUS

## 2021-06-04 MED ORDER — ONDANSETRON 4 MG PO TBDP: 4.0000 mg | ORAL_TABLET | Freq: Four times a day (QID) | ORAL | Status: DC | PRN

## 2021-06-04 MED ORDER — LACTATED RINGERS IV SOLN: INTRAVENOUS | Status: DC

## 2021-06-04 MED ORDER — METRONIDAZOLE 500 MG/100ML IV SOLN
500.0000 mg | Freq: Once | INTRAVENOUS | Status: AC
  Administered 2021-06-04: 500 mg via INTRAVENOUS
  Filled 2021-06-04: qty 100

## 2021-06-04 MED ORDER — LIDOCAINE HCL (CARDIAC) PF 100 MG/5ML IV SOSY
PREFILLED_SYRINGE | INTRAVENOUS | Status: DC | PRN
  Administered 2021-06-04: 80 mg via INTRAVENOUS

## 2021-06-04 MED ORDER — INSULIN ASPART 100 UNIT/ML IJ SOLN
0.0000 [IU] | Freq: Three times a day (TID) | INTRAMUSCULAR | Status: DC
  Administered 2021-06-04: 5 [IU] via SUBCUTANEOUS
  Administered 2021-06-05: 2 [IU] via SUBCUTANEOUS
  Filled 2021-06-04 (×2): qty 1

## 2021-06-04 MED ORDER — OXYCODONE HCL 5 MG PO TABS
5.0000 mg | ORAL_TABLET | Freq: Once | ORAL | Status: AC | PRN
  Administered 2021-06-04: 5 mg via ORAL

## 2021-06-04 MED ORDER — PHENYLEPHRINE HCL (PRESSORS) 10 MG/ML IV SOLN
INTRAVENOUS | Status: DC | PRN
  Administered 2021-06-04: 100 ug via INTRAVENOUS

## 2021-06-04 MED ORDER — KETOROLAC TROMETHAMINE 30 MG/ML IJ SOLN
INTRAMUSCULAR | Status: AC
  Filled 2021-06-04: qty 1

## 2021-06-04 MED ORDER — ROCURONIUM BROMIDE 10 MG/ML (PF) SYRINGE
PREFILLED_SYRINGE | INTRAVENOUS | Status: AC
  Filled 2021-06-04: qty 10

## 2021-06-04 MED ORDER — DEXAMETHASONE SODIUM PHOSPHATE 10 MG/ML IJ SOLN
INTRAMUSCULAR | Status: AC
  Filled 2021-06-04: qty 1

## 2021-06-04 MED ORDER — CEFTRIAXONE SODIUM 2 G IJ SOLR
2.0000 g | INTRAMUSCULAR | Status: DC
  Filled 2021-06-04: qty 20

## 2021-06-04 MED ORDER — 0.9 % SODIUM CHLORIDE (POUR BTL) OPTIME
TOPICAL | Status: DC | PRN
  Administered 2021-06-04: 500 mL

## 2021-06-04 MED ORDER — FENTANYL CITRATE (PF) 100 MCG/2ML IJ SOLN
INTRAMUSCULAR | Status: AC
  Administered 2021-06-04: 25 ug via INTRAVENOUS
  Filled 2021-06-04: qty 2

## 2021-06-04 MED ORDER — FENTANYL CITRATE (PF) 100 MCG/2ML IJ SOLN
INTRAMUSCULAR | Status: AC
  Filled 2021-06-04: qty 2

## 2021-06-04 MED ORDER — DROPERIDOL 2.5 MG/ML IJ SOLN
0.6250 mg | Freq: Once | INTRAMUSCULAR | Status: DC | PRN
  Filled 2021-06-04: qty 2

## 2021-06-04 MED ORDER — ROCURONIUM BROMIDE 100 MG/10ML IV SOLN
INTRAVENOUS | Status: DC | PRN
  Administered 2021-06-04: 10 mg via INTRAVENOUS
  Administered 2021-06-04: 50 mg via INTRAVENOUS

## 2021-06-04 MED ORDER — BUPROPION HCL ER (XL) 150 MG PO TB24: 150.0000 mg | ORAL_TABLET | Freq: Every day | ORAL | Status: DC

## 2021-06-04 MED ORDER — EPHEDRINE 5 MG/ML INJ
INTRAVENOUS | Status: AC
  Filled 2021-06-04: qty 5

## 2021-06-04 MED ORDER — HEMOSTATIC AGENTS (NO CHARGE) OPTIME
TOPICAL | Status: DC | PRN
  Administered 2021-06-04: 1 via TOPICAL

## 2021-06-04 MED ORDER — HYDROMORPHONE HCL 1 MG/ML IJ SOLN
0.5000 mg | INTRAMUSCULAR | Status: DC | PRN
  Administered 2021-06-04: 0.5 mg via INTRAVENOUS
  Filled 2021-06-04: qty 1

## 2021-06-04 MED ORDER — IOHEXOL 350 MG/ML SOLN
75.0000 mL | Freq: Once | INTRAVENOUS | Status: AC | PRN
  Administered 2021-06-04: 75 mL via INTRAVENOUS
  Filled 2021-06-04: qty 75

## 2021-06-04 MED ORDER — SUGAMMADEX SODIUM 200 MG/2ML IV SOLN
INTRAVENOUS | Status: DC | PRN
  Administered 2021-06-04: 165.2 mg via INTRAVENOUS

## 2021-06-04 MED ORDER — DEXAMETHASONE SODIUM PHOSPHATE 10 MG/ML IJ SOLN
INTRAMUSCULAR | Status: DC | PRN
  Administered 2021-06-04: 5 mg via INTRAVENOUS

## 2021-06-04 MED ORDER — MELATONIN 5 MG PO TABS
10.0000 mg | ORAL_TABLET | Freq: Every evening | ORAL | Status: DC | PRN
  Filled 2021-06-04: qty 2

## 2021-06-04 MED ORDER — ONDANSETRON HCL 4 MG/2ML IJ SOLN
4.0000 mg | Freq: Four times a day (QID) | INTRAMUSCULAR | Status: DC | PRN
  Administered 2021-06-04 – 2021-06-05 (×3): 4 mg via INTRAVENOUS
  Filled 2021-06-04 (×3): qty 2

## 2021-06-04 SURGICAL SUPPLY — 53 items
APPLICATOR COTTON TIP 6 STRL (MISCELLANEOUS) ×1 IMPLANT
APPLICATOR COTTON TIP 6IN STRL (MISCELLANEOUS) ×2 IMPLANT
APPLIER CLIP 5 13 M/L LIGAMAX5 (MISCELLANEOUS)
BLADE CLIPPER SURG (BLADE) IMPLANT
BLADE SURG SZ11 CARB STEEL (BLADE) ×2 IMPLANT
CHLORAPREP W/TINT 26 (MISCELLANEOUS) ×2 IMPLANT
CLIP APPLIE 5 13 M/L LIGAMAX5 (MISCELLANEOUS) IMPLANT
CUTTER FLEX LINEAR 45M (STAPLE) ×2 IMPLANT
DEFOGGER SCOPE WARMER CLEARIFY (MISCELLANEOUS) ×2 IMPLANT
DERMABOND ADVANCED (GAUZE/BANDAGES/DRESSINGS) ×1
DERMABOND ADVANCED .7 DNX12 (GAUZE/BANDAGES/DRESSINGS) ×1 IMPLANT
ELECT CAUTERY BLADE TIP 2.5 (TIP) ×4
ELECT REM PT RETURN 9FT ADLT (ELECTROSURGICAL) ×2
ELECTRODE CAUTERY BLDE TIP 2.5 (TIP) ×2 IMPLANT
ELECTRODE REM PT RTRN 9FT ADLT (ELECTROSURGICAL) ×1 IMPLANT
GAUZE 4X4 16PLY ~~LOC~~+RFID DBL (SPONGE) ×2 IMPLANT
GLOVE SURG SYN 6.5 ES PF (GLOVE) ×2 IMPLANT
GLOVE SURG UNDER LTX SZ7 (GLOVE) ×2 IMPLANT
GOWN STRL REUS W/ TWL LRG LVL3 (GOWN DISPOSABLE) ×2 IMPLANT
GOWN STRL REUS W/TWL LRG LVL3 (GOWN DISPOSABLE) ×2
GRASPER SUT TROCAR 14GX15 (MISCELLANEOUS) ×2 IMPLANT
HEMOSTAT SURGICEL 2X3 (HEMOSTASIS) ×2 IMPLANT
IRRIGATION STRYKERFLOW (MISCELLANEOUS) ×1 IMPLANT
IRRIGATOR STRYKERFLOW (MISCELLANEOUS) ×2
IV NS 1000ML (IV SOLUTION) ×1
IV NS 1000ML BAXH (IV SOLUTION) ×1 IMPLANT
KIT TURNOVER KIT A (KITS) ×2 IMPLANT
KITTNER LAPARASCOPIC 5X40 (MISCELLANEOUS) ×2 IMPLANT
LABEL OR SOLS (LABEL) ×2 IMPLANT
MANIFOLD NEPTUNE II (INSTRUMENTS) ×2 IMPLANT
NEEDLE HYPO 22GX1.5 SAFETY (NEEDLE) ×2 IMPLANT
NS IRRIG 500ML POUR BTL (IV SOLUTION) ×2 IMPLANT
PACK LAP CHOLECYSTECTOMY (MISCELLANEOUS) ×2 IMPLANT
PENCIL ELECTRO HAND CTR (MISCELLANEOUS) ×2 IMPLANT
POUCH SPECIMEN RETRIEVAL 10MM (ENDOMECHANICALS) ×2 IMPLANT
RELOAD STAPLE TA45 3.5 REG BLU (ENDOMECHANICALS) ×4 IMPLANT
SCISSORS METZENBAUM CVD 33 (INSTRUMENTS) ×2 IMPLANT
SET TUBE SMOKE EVAC HIGH FLOW (TUBING) ×2 IMPLANT
SHEARS HARMONIC ACE PLUS 36CM (ENDOMECHANICALS) ×2 IMPLANT
SLEEVE ADV FIXATION 5X100MM (TROCAR) ×2 IMPLANT
SLEEVE PROTECTION STRL DISP (MISCELLANEOUS) ×2 IMPLANT
STRIP CLOSURE SKIN 1/2X4 (GAUZE/BANDAGES/DRESSINGS) ×2 IMPLANT
SUT MNCRL 4-0 (SUTURE) ×1
SUT MNCRL 4-0 27XMFL (SUTURE) ×1
SUT VIC AB 3-0 SH 27 (SUTURE) ×1
SUT VIC AB 3-0 SH 27X BRD (SUTURE) ×1 IMPLANT
SUT VICRYL 0 AB UR-6 (SUTURE) ×2 IMPLANT
SUTURE MNCRL 4-0 27XMF (SUTURE) ×1 IMPLANT
SYS KII FIOS ACCESS ABD 5X100 (TROCAR) ×2
SYSTEM KII FIOS ACES ABD 5X100 (TROCAR) ×1 IMPLANT
TRAY FOLEY MTR SLVR 16FR STAT (SET/KITS/TRAYS/PACK) ×2 IMPLANT
TROCAR ADV FIXATION 12X100MM (TROCAR) ×2 IMPLANT
TROCAR BALLN GELPORT 12X130M (ENDOMECHANICALS) ×2 IMPLANT

## 2021-06-04 NOTE — H&P (Signed)
Reason for Consult: Acute appendicitis Referring Physician: Artis Delay, MD (emergency department)  Vickie Greer is an 60 y.o. female.  HPI: She presented to the emergency department with a 24-hour history of abdominal pain and nausea.  Apparently she has had intermittent abdominal pain over the past few months.  She had an abdominal ultrasound that was normal.  She had sudden onset of severe right lower quadrant pain starting about 24 hours ago.  It has been associated with nauseam, but no vomiting.  She does not have an appetite.  She reports subjective low-grade fevers.  The pain is constant and nothing seems to alleviate or exacerbate it.  Evaluation in the emergency department included labs that demonstrated a leukocytosis to 13.3 and a CT scan of the abdomen and pelvis that showed acute appendicitis.  Past Medical History:  Diagnosis Date   Diabetes mellitus without complication (HCC)    Hypertension     Past Surgical History:  Procedure Laterality Date   CATARACT EXTRACTION Right    CHOLECYSTECTOMY     DILATION AND CURETTAGE OF UTERUS     TUBAL LIGATION     UTERINE FIBROID EMBOLIZATION      Family History  Problem Relation Age of Onset   Breast cancer Mother 33   Hypertension Father    Hypoparathyroidism Sister    Stomach cancer Paternal Grandmother    Breast cancer Paternal Aunt 28   Breast cancer Paternal Aunt 59    Social History:  reports that she has never smoked. She has never used smokeless tobacco. She reports that she does not drink alcohol and does not use drugs.  Allergies:  Allergies  Allergen Reactions   Amoxicillin    Clam Shell    Penicillins    Shellfish Allergy Other (See Comments)    Medications: I have reviewed the patient's current medications.  Results for orders placed or performed during the hospital encounter of 06/04/21 (from the past 48 hour(s))  Lipase, blood     Status: None   Collection Time: 06/04/21  7:33 AM  Result Value Ref Range    Lipase 25 11 - 51 U/L    Comment: Performed at [facility], [address] [place], [place] [address]  Comprehensive metabolic panel     Status: Abnormal   Collection Time: 06/04/21  7:33 AM  Result Value Ref Range   Sodium 140 135 - 145 mmol/L   Potassium 4.0 3.5 - 5.1 mmol/L   Chloride 100 98 - 111 mmol/L   CO2 26 22 - 32 mmol/L   Glucose, Bld 179 (H) 70 - 99 mg/dL    Comment: Glucose reference range applies only to samples taken after fasting for at least 8 hours.   BUN 13 6 - 20 mg/dL   Creatinine, Ser 7.01 0.44 - 1.00 mg/dL   Calcium 9.5 8.9 - 77.9 mg/dL   Total Protein 7.3 6.5 - 8.1 g/dL   Albumin 4.2 3.5 - 5.0 g/dL   AST 19 15 - 41 U/L   ALT 25 0 - 44 U/L   Alkaline Phosphatase 63 38 - 126 U/L   Total Bilirubin 2.2 (H) 0.3 - 1.2 mg/dL   GFR, Estimated >39 >03 mL/min    Comment: (NOTE) Calculated using the CKD-EPI Creatinine Equation (2021)    Anion gap 14 5 - 15    Comment: Performed at [facility], [address], [place], [place] [address]  CBC     Status: Abnormal   Collection Time: 06/04/21  7:33 AM  Result Value Ref Range   WBC 13.3 (H) 4.0 - 10.5 K/uL   RBC 4.76 3.87 - 5.11 MIL/uL   Hemoglobin 14.9 12.0 - 15.0 g/dL   HCT 02.5 42.7 - 06.2 %   MCV 89.3 80.0 - 100.0 fL   MCH 31.3 26.0 - 34.0 pg   MCHC 35.1 30.0 - 36.0 g/dL   RDW 37.6 28.3 - 15.1 %   Platelets 204 150 - 400 K/uL   nRBC 0.0 0.0 - 0.2 %    Comment: Performed at [facility], [address] [place], [place] [address]  Urinalysis, Complete w Microscopic Urine, Random     Status: Abnormal   Collection Time: 06/04/21  7:34 AM  Result Value Ref Range   Color, Urine YELLOW YELLOW   APPearance CLEAR CLEAR   Specific Gravity, Urine >1.030 (H) 1.005 - 1.030   pH 5.5 5.0 - 8.0   Glucose, UA NEGATIVE NEGATIVE mg/dL   Hgb urine dipstick NEGATIVE NEGATIVE   Bilirubin Urine NEGATIVE NEGATIVE   Ketones, ur 40 (A) NEGATIVE mg/dL   Protein, ur NEGATIVE NEGATIVE mg/dL    Nitrite NEGATIVE NEGATIVE   Leukocytes,Ua NEGATIVE NEGATIVE   RBC / HPF 0-5 0 - 5 RBC/hpf   WBC, UA 0-5 0 - 5 WBC/hpf   Bacteria, UA RARE (A) NONE SEEN   Squamous Epithelial / LPF 0-5 0 - 5   Mucus PRESENT     Comment: Performed at [facility], [address] [place], [place] [address]    CT ABDOMEN PELVIS W CONTRAST  Result Date: 06/04/2021 CLINICAL DATA:  Abdominal distension and pain in RIGHT lower quadrant pain in a 60 year old female. EXAM: CT ABDOMEN AND PELVIS WITH CONTRAST TECHNIQUE: Multidetector CT imaging of the abdomen and pelvis was performed using the standard protocol following bolus administration of intravenous contrast. CONTRAST:  24mL OMNIPAQUE IOHEXOL 350 MG/ML SOLN COMPARISON:  Abdominal sonogram of May 25, 2021. FINDINGS: Lower chest: Unremarkable. Hepatobiliary: Mild hepatic steatosis. Post cholecystectomy without substantial biliary duct dilation accounting for cholecystectomy changes. No focal, suspicious hepatic lesion. The portal vein is patent. Hepatic veins are patent Pancreas: Normal, without mass, inflammation or ductal dilatation. Spleen: Spleen normal size and contour. Adrenals/Urinary Tract: Adrenal glands are normal. Symmetric renal enhancement. No hydronephrosis, suspicious renal lesion or perinephric stranding. Urinary bladder with mild stranding adjacent to the urinary bladder in the setting of acute appendicitis in the RIGHT lower quadrant. Stomach/Bowel: Small hiatal hernia. Stranding adjacent to small bowel loops in the RIGHT lower quadrant but without primary small bowel process. Acute appendicitis with periappendiceal stranding, fascial thickening and trace periappendiceal fluid in the RIGHT lower quadrant. Dilated appendix up to 12 mm with signs of wall thickening. Mild secondary thickening of the cecum. No periappendiceal abscess. No signs of free air. Vascular/Lymphatic: Smooth contour of both the aorta and IVC with scattered aortic  atherosclerosis. There is no gastrohepatic or hepatoduodenal ligament lymphadenopathy. No retroperitoneal or mesenteric lymphadenopathy. No pelvic sidewall lymphadenopathy. Reproductive: Tubal ligation clips on the RIGHT and LEFT. No adnexal mass. Other: No frank ascites or free air. Trace fluid and or stranding around the dilated and inflamed appendix. Musculoskeletal: No acute musculoskeletal process. No destructive bone finding. IMPRESSION: 1. Acute appendicitis with trace periappendiceal fluid and secondary inflammation of adjacent cecum and small bowel. No abscess or free air. 2. Mild hepatic steatosis. 3. Post cholecystectomy 4. Small hiatal hernia. 5. Aortic atherosclerosis. Aortic Atherosclerosis (ICD10-I70.0). These results were called by telephone at the time of  interpretation on 06/04/2021 at 11:36 am to provider [facility] , who verbally acknowledged these results. Electronically Signed   By: Donzetta Kohut M.D.   On: 06/04/2021 11:37    Review of Systems  Gastrointestinal:  Positive for abdominal pain and nausea. Negative for vomiting.  All other systems reviewed and are negative. Blood pressure 131/64, pulse 90, temperature 98.8 F (37.1 C), temperature source Oral, resp. rate 18, height 5\' 6"  (1.676 m), weight 82.6 kg, SpO2 100 %. Physical Exam Constitutional:      General: She is not in acute distress. HENT:     Head: Normocephalic and atraumatic.     Nose:     Comments: Covered with a mask.    Mouth/Throat:     Comments: Covered with a mask. Eyes:     General: No scleral icterus.       Right eye: No discharge.        Left eye: No discharge.  Cardiovascular:     Rate and Rhythm: Normal rate and regular rhythm.  Pulmonary:     Effort: Pulmonary effort is normal. No respiratory distress.  Abdominal:     Palpations: Abdomen is soft.     Tenderness: There is abdominal tenderness.     Comments: Periumbilical and RLQ tenderness.  Genitourinary:    Comments:  deferred Musculoskeletal:        General: No deformity.  Skin:    General: Skin is warm and dry.  Neurological:     General: No focal deficit present.     Mental Status: She is alert.  Psychiatric:        Mood and Affect: Mood normal.        Behavior: Behavior normal.    Assessment/Plan: This is a 60 year old woman with right lower quadrant pain and findings consistent with acute appendicitis.  We will plan to proceed to the operating room pending OR and anesthesia availability.  46 06/04/2021, 1:56 PM

## 2021-06-04 NOTE — Progress Notes (Addendum)
Dr. Lady Gary called and notified of standing order that reads " If Diabetic or Glucose >140 notify Physician to order Glycemic Control order set." I reported that Pt. Is a Type 2 Diabetic and that she states she takes Glimepiride Daily. I also reported that Pt. Has History of Hypertension and that Pt. States she takes Propanolol 5-10 mg a day for her Hypertension. I reported Patients B/P's since arrival to unit. Dr. Lady Gary states she will order Sliding Scale Insulin and CBG checks. I also reported that Pt. Has an order for D5LR at 100.

## 2021-06-04 NOTE — Transfer of Care (Signed)
Immediate Anesthesia Transfer of Care Note  Patient: Vickie Greer  Procedure(s) Performed: APPENDECTOMY LAPAROSCOPIC (Abdomen)  Patient Location: PACU  Anesthesia Type:General  Level of Consciousness: awake and alert   Airway & Oxygen Therapy: Patient Spontanous Breathing and Patient connected to face mask oxygen  Post-op Assessment: Report given to RN and Post -op Vital signs reviewed and stable  Post vital signs: Reviewed and stable  Last Vitals:  Vitals Value Taken Time  BP    Temp    Pulse 95 06/04/21 1839  Resp 12 06/04/21 1839  SpO2 100 % 06/04/21 1839  Vitals shown include unvalidated device data.  Last Pain:  Vitals:   06/04/21 1552  TempSrc: Oral  PainSc:          Complications: No notable events documented.

## 2021-06-04 NOTE — Progress Notes (Signed)
Dr. Lady Gary notified of 2151 CBG of 220. Order to Modify Sliding Scale Insulin to include HS coverage Received.

## 2021-06-04 NOTE — Anesthesia Preprocedure Evaluation (Addendum)
Anesthesia Evaluation  Patient identified by MRN, date of birth, ID band Patient awake    Reviewed: Allergy & Precautions, NPO status , Patient's Chart, lab work & pertinent test results, reviewed documented beta blocker date and time   Airway Mallampati: II  TM Distance: >3 FB Neck ROM: full    Dental  (+) Teeth Intact   Pulmonary neg pulmonary ROS,    Pulmonary exam normal        Cardiovascular Exercise Tolerance: Good hypertension, Pt. on home beta blockers and Pt. on medications  Rhythm:Regular Rate:Normal + Systolic murmurs    Neuro/Psych negative neurological ROS  negative psych ROS   GI/Hepatic Neg liver ROS, appendicitis   Endo/Other  diabetes, Type 2, Oral Hypoglycemic Agents  Renal/GU negative Renal ROS  negative genitourinary   Musculoskeletal negative musculoskeletal ROS (+)   Abdominal Normal abdominal exam  (+)  Abdomen: soft.    Peds negative pediatric ROS (+)  Hematology negative hematology ROS (+)   Anesthesia Other Findings Past Medical History: No date: Diabetes mellitus without complication (HCC) No date: Hypertension  Past Surgical History: No date: CATARACT EXTRACTION; Right No date: CHOLECYSTECTOMY No date: DILATION AND CURETTAGE OF UTERUS No date: TUBAL LIGATION No date: UTERINE FIBROID EMBOLIZATION  BMI    Body Mass Index: 29.38 kg/m      Reproductive/Obstetrics negative OB ROS                            Anesthesia Physical Anesthesia Plan  ASA: 2  Anesthesia Plan: General ETT   Post-op Pain Management:    Induction: Intravenous  PONV Risk Score and Plan: 3 and Ondansetron, Dexamethasone, Midazolam and Treatment may vary due to age or medical condition  Airway Management Planned: Oral ETT  Additional Equipment:   Intra-op Plan:   Post-operative Plan: Extubation in OR  Informed Consent: I have reviewed the patients History and  Physical, chart, labs and discussed the procedure including the risks, benefits and alternatives for the proposed anesthesia with the patient or authorized representative who has indicated his/her understanding and acceptance.     Dental Advisory Given  Plan Discussed with: Anesthesiologist, CRNA and Surgeon  Anesthesia Plan Comments:         Anesthesia Quick Evaluation

## 2021-06-04 NOTE — Anesthesia Procedure Notes (Signed)
Procedure Name: Intubation Date/Time: 06/04/2021 5:05 PM Performed by: Hedda Slade, CRNA Pre-anesthesia Checklist: Patient identified, Patient being monitored, Timeout performed, Emergency Drugs available and Suction available Patient Re-evaluated:Patient Re-evaluated prior to induction Oxygen Delivery Method: Circle system utilized Preoxygenation: Pre-oxygenation with 100% oxygen Induction Type: IV induction Ventilation: Mask ventilation without difficulty Laryngoscope Size: Mac and 3 Grade View: Grade I Tube type: Oral Tube size: 7.0 mm Number of attempts: 1 Airway Equipment and Method: Stylet Placement Confirmation: ETT inserted through vocal cords under direct vision, positive ETCO2 and breath sounds checked- equal and bilateral Secured at: 21 cm Tube secured with: Tape Dental Injury: Teeth and Oropharynx as per pre-operative assessment

## 2021-06-04 NOTE — ED Triage Notes (Signed)
C/o abdominal pain with nausea X24 hours.

## 2021-06-04 NOTE — Plan of Care (Signed)
Pt. Transferred to Room 350 Post Lap. Appy. She is resting with eyes closed;awakens easily for assessment/VS. Assessment and VS WNL. Pt. Oriented to Room High Fall risk and Fall Prevention and POC. Pt. V/o.

## 2021-06-04 NOTE — ED Provider Notes (Signed)
[facility] Emergency Department Provider Note  ____________________________________________   Event Date/Time   First MD Initiated Contact with Patient 06/04/21 [id number]     (approximate)  I have reviewed the triage vital signs and the nursing notes.   HISTORY  Chief Complaint Abdominal Pain    HPI Vickie Greer is a 60 y.o. female with diabetes who comes in with abdominal pain and nausea for the past 24 hours.  Patient reports some intermittent abdominal pain over the past few months.  Had an ultrasound done of her liver, pancreas, kidneys that was normal.  States that she had sudden onset of severe pain in her right lower quadrant that started yesterday.  Is associated with some nausea and vomiting.  States that she does not feel like she can eat.  Reports of low-grade temperatures.  Her pain is constant, nothing makes it better or worse.  Denies any chest pain, shortness of breath or urinary symptoms.  Patient reports prior history of cholecystectomy but still has her appendix.          Past Medical History:  Diagnosis Date   Diabetes mellitus without complication (HCC)    Hypertension     Patient Active Problem List   Diagnosis Date Noted   Annual physical exam 01/02/2018   Vaginal atrophy 01/02/2018    Past Surgical History:  Procedure Laterality Date   CATARACT EXTRACTION Right    CHOLECYSTECTOMY     DILATION AND CURETTAGE OF UTERUS     TUBAL LIGATION     UTERINE FIBROID EMBOLIZATION      Prior to Admission medications   Medication Sig Start Date End Date Taking? Authorizing Provider  buPROPion (WELLBUTRIN XL) 150 MG 24 hr tablet Take by mouth. 08/24/17 08/24/18  [provider]  buPROPion (WELLBUTRIN XL) 150 MG 24 hr tablet Take 150 mg by mouth daily. 12/12/17   [provider]  ibuprofen (ADVIL,MOTRIN) 600 MG tablet Take 1 tablet (600 mg total) by mouth every 6 (six) hours as needed. 06/15/18   Enid Derry, PA-C   Melatonin 10 MG TABS Take by mouth.    [provider]  metFORMIN (GLUCOPHAGE) 500 MG tablet Take 500 mg by mouth 2 (two) times daily with a meal. 11/11/17   [provider]  Vaginal Lubricant (REPLENS) GEL Place 1 application vaginally 2 (two) times a week. 01/02/18   Nadara Mustard, MD    Allergies Amoxicillin, Clam shell, Penicillins, and Shellfish allergy  Family History  Problem Relation Age of Onset   Breast cancer Mother 27   Hypertension Father    Hypoparathyroidism Sister    Stomach cancer Paternal Grandmother    Breast cancer Paternal Aunt 58   Breast cancer Paternal Aunt 34    Social History Social History   Tobacco Use   Smoking status: Never   Smokeless tobacco: Never  Vaping Use   Vaping Use: Never used  Substance Use Topics   Alcohol use: Never   Drug use: Never    Comment: prescribed xanax      Review of Systems Constitutional: Positive fever Eyes: No visual changes. ENT: No sore throat. Cardiovascular: Denies chest pain. Respiratory: Denies shortness of breath. Gastrointestinal: Abdominal pain, nausea, vomiting Genitourinary: Negative for dysuria. Musculoskeletal: Negative for back pain. Skin: Negative for rash. Neurological: Negative for headaches, focal weakness or numbness. All other ROS negative ____________________________________________   PHYSICAL EXAM:  VITAL SIGNS: ED Triage Vitals  Enc Vitals Group     BP 06/04/21 0734  136/78     Pulse Rate 06/04/21 0734 (!) 110     Resp 06/04/21 0734 20     Temp 06/04/21 0734 98.8 F (37.1 C)     Temp Source 06/04/21 0734 Oral     SpO2 06/04/21 0734 99 %     Weight 06/04/21 0717 182 lb (82.6 kg)     Height 06/04/21 0717 5\' 6"  (1.676 m)     Head Circumference --      Peak Flow --      Pain Score 06/04/21 0717 5     Pain Loc --      Pain Edu? --      Excl. in GC? --     Constitutional: Alert and oriented. Well appearing and in no acute distress. Eyes: Conjunctivae  are normal. EOMI. Head: Atraumatic. Nose: No congestion/rhinnorhea. Mouth/Throat: Mucous membranes are moist.   Neck: No stridor. Trachea Midline. FROM Cardiovascular: Normal rate, regular rhythm. Grossly normal heart sounds.  Good peripheral circulation. Respiratory: Normal respiratory effort.  No retractions. Lungs CTAB. Gastrointestinal: Tender in the right lower quadrant.  No distention. No abdominal bruits.  Musculoskeletal: No lower extremity tenderness nor edema.  No joint effusions. Neurologic:  Normal speech and language. No gross focal neurologic deficits are appreciated.  Skin:  Skin is warm, dry and intact. No rash noted. Psychiatric: Mood and affect are normal. Speech and behavior are normal. GU: Deferred   ____________________________________________   LABS (all labs ordered are listed, but only abnormal results are displayed)  Labs Reviewed  COMPREHENSIVE METABOLIC PANEL - Abnormal; Notable for the following components:      Result Value   Glucose, Bld 179 (*)    Total Bilirubin 2.2 (*)    All other components within normal limits  CBC - Abnormal; Notable for the following components:   WBC 13.3 (*)    All other components within normal limits  URINALYSIS, COMPLETE (UACMP) WITH MICROSCOPIC - Abnormal; Notable for the following components:   Specific Gravity, Urine >1.030 (*)    Ketones, ur 40 (*)    Bacteria, UA RARE (*)    All other components within normal limits  LIPASE, BLOOD   ____________________________________________  RADIOLOGY  Official radiology report(s): CT ABDOMEN PELVIS W CONTRAST  Result Date: 06/04/2021 CLINICAL DATA:  Abdominal distension and pain in RIGHT lower quadrant pain in a 60 year old female. EXAM: CT ABDOMEN AND PELVIS WITH CONTRAST TECHNIQUE: Multidetector CT imaging of the abdomen and pelvis was performed using the standard protocol following bolus administration of intravenous contrast. CONTRAST:  17mL OMNIPAQUE IOHEXOL 350 MG/ML  SOLN COMPARISON:  Abdominal sonogram of May 25, 2021. FINDINGS: Lower chest: Unremarkable. Hepatobiliary: Mild hepatic steatosis. Post cholecystectomy without substantial biliary duct dilation accounting for cholecystectomy changes. No focal, suspicious hepatic lesion. The portal vein is patent. Hepatic veins are patent Pancreas: Normal, without mass, inflammation or ductal dilatation. Spleen: Spleen normal size and contour. Adrenals/Urinary Tract: Adrenal glands are normal. Symmetric renal enhancement. No hydronephrosis, suspicious renal lesion or perinephric stranding. Urinary bladder with mild stranding adjacent to the urinary bladder in the setting of acute appendicitis in the RIGHT lower quadrant. Stomach/Bowel: Small hiatal hernia. Stranding adjacent to small bowel loops in the RIGHT lower quadrant but without primary small bowel process. Acute appendicitis with periappendiceal stranding, fascial thickening and trace periappendiceal fluid in the RIGHT lower quadrant. Dilated appendix up to 12 mm with signs of wall thickening. Mild secondary thickening of the cecum. No periappendiceal abscess. No signs of free air. Vascular/Lymphatic: Smooth  contour of both the aorta and IVC with scattered aortic atherosclerosis. There is no gastrohepatic or hepatoduodenal ligament lymphadenopathy. No retroperitoneal or mesenteric lymphadenopathy. No pelvic sidewall lymphadenopathy. Reproductive: Tubal ligation clips on the RIGHT and LEFT. No adnexal mass. Other: No frank ascites or free air. Trace fluid and or stranding around the dilated and inflamed appendix. Musculoskeletal: No acute musculoskeletal process. No destructive bone finding. IMPRESSION: 1. Acute appendicitis with trace periappendiceal fluid and secondary inflammation of adjacent cecum and small bowel. No abscess or free air. 2. Mild hepatic steatosis. 3. Post cholecystectomy 4. Small hiatal hernia. 5. Aortic atherosclerosis. Aortic Atherosclerosis  (ICD10-I70.0). These results were called by telephone at the time of interpretation on 06/04/2021 at 11:36 am to provider [facility] , who verbally acknowledged these results. Electronically Signed   By: Donzetta Kohut M.D.   On: 06/04/2021 11:37    ____________________________________________   PROCEDURES  Procedure(s) performed (including Critical Care):  Procedures   ____________________________________________   INITIAL IMPRESSION / ASSESSMENT AND PLAN / ED COURSE  Vickie Greer was evaluated in Emergency Department on 06/04/2021 for the symptoms described in the history of present illness. She was evaluated in the context of the global COVID-19 pandemic, which necessitated consideration that the patient might be at risk for infection with the SARS-CoV-2 virus that causes COVID-19. Institutional protocols and algorithms that pertain to the evaluation of patients at risk for COVID-19 are in a state of rapid change based on information released by regulatory bodies including the CDC and federal and state organizations. These policies and algorithms were followed during the patient's care in the ED.    Patient is a 60 year old who comes in with right lower quadrant abdominal pain, tachycardia and some white count elevation.  Will get CT scan to evaluate for appendicitis, perforation, abscess, obstruction or other acute pathology.  Urine without evidence of UTI.   CT shows appendicitis.  Patient was started on ceftriaxone and Flagyl.  Discussed with Dr. Lady Gary who will admit patient        ____________________________________________   FINAL CLINICAL IMPRESSION(S) / ED DIAGNOSES   Final diagnoses:  Acute appendicitis, unspecified acute appendicitis type      MEDICATIONS GIVEN DURING THIS VISIT:  Medications  cefTRIAXone (ROCEPHIN) 1 g in sodium chloride 0.9 % 100 mL IVPB (has no administration in time range)  metroNIDAZOLE (FLAGYL) IVPB 500 mg (has no administration in time  range)  sodium chloride 0.9 % bolus 1,000 mL (0 mLs Intravenous Stopped 06/04/21 1200)  iohexol (OMNIPAQUE) 350 MG/ML injection 75 mL (75 mLs Intravenous Contrast Given 06/04/21 1101)  fentaNYL (SUBLIMAZE) injection 50 mcg (50 mcg Intravenous Given 06/04/21 1203)     ED Discharge Orders     None        Note:  This document was prepared using Dragon voice recognition software and may include unintentional dictation errors.    Concha Se, MD 06/04/21 [phone]

## 2021-06-04 NOTE — Op Note (Signed)
Operative Note  Laparoscopic Appendectomy   Vickie Greer Date of operation:  06/04/2021  Indications: The patient presented with a history of  abdominal pain. Workup has revealed findings consistent with acute appendicitis.  Pre-operative Diagnosis: Acute appendicitis without perforation or abscess  Post-operative Diagnosis: Same  Surgeon: Duanne Guess, MD  Anesthesia: GETA  Findings: Adhesions of omentum to the umbilicus, likely secondary to her prior laparoscopic cholecystectomy in the past.  The appendix was acutely inflamed with serositis present.  No perforation or abscess was identified.  Peritonitis was localized to the area surrounding the appendix.  Estimated Blood Loss: Less than 5 cc         Specimens: appendix         Complications: None immediately apparent  Procedure Details  The patient was seen again in the preop area. The options of surgery versus observation were reviewed with the patient and/or family. The risks of bleeding, infection, recurrence of symptoms, negative laparoscopy, potential for an open procedure, bowel injury, abscess or infection, were all reviewed as well. The patient was taken to Operating Room, identified as Vickie Greer and the procedure verified as laparoscopic appendectomy. A time out was performed and the above information confirmed.  The patient was placed in the supine position and general anesthesia was induced.  Antibiotic prophylaxis was administered and VT E prophylaxis was in place. A Foley catheter was placed by the nursing staff.   The abdomen was prepped and draped in a sterile fashion.  Optiview technique was used to enter the abdomen at Palmer's point with a 5 mm trocar.  Pneumoperitoneum obtained without any hemodynamic changes.  Immediately, adhesions of omentum to the underside of the umbilicus were appreciated.  A left lower quadrant 5 mm port was placed in the adhesions were lysed using the harmonic scalpel.  No bowel was  appreciated within the tissue.  A 10 mm port was then placed.  All sites were injected with local anesthetic prior to making incision and placing the trocar.  All trochars were placed under direct vision.  The appendix was identified and found to be acutely inflamed with a very thickened medial appendix.  There was no evidence of perforation and no abscess in the abdominal cavity. The appendix was carefully dissected away from the surrounding tissues.  The mesoappendix was divided with the harmonic scalpel.  The base of the appendix was extremely wide and thickened. The base of the appendix was dissected out and divided with 2 firings of a a standard load Endo GIA.The appendix was placed in a Endo Catch bag and removed via the 10 mm port.  The right lower quadrant was inspected there was no sign of bleeding or bowel injury therefore pneumoperitoneum was released, all ports were removed.  The 10 the millimeter port site was closed with 0 Vicryl interrupted sutures and the skin incisions were approximated with subcuticular 4-0 Monocryl. Dermabond was applied, followed by Steri-Strips. The patient tolerated the procedure well and there were no immediately apparent complications. The sponge lap and needle count were correct at the end of the procedure.  The patient was taken to the recovery room in stable condition to be admitted for continued care.   Duanne Guess, MD, FACS

## 2021-06-04 NOTE — Anesthesia Postprocedure Evaluation (Signed)
Anesthesia Post Note  Patient: Vickie Greer  Procedure(s) Performed: APPENDECTOMY LAPAROSCOPIC (Abdomen)  Patient location during evaluation: PACU Anesthesia Type: General Level of consciousness: awake and alert Pain management: pain level controlled Vital Signs Assessment: post-procedure vital signs reviewed and stable Respiratory status: spontaneous breathing, nonlabored ventilation and respiratory function stable Cardiovascular status: blood pressure returned to baseline and stable Postop Assessment: no apparent nausea or vomiting Anesthetic complications: no   No notable events documented.   Last Vitals:  Vitals:   06/04/21 1915 06/04/21 1947  BP:  (!) 142/67  Pulse: 94 92  Resp: 10 20  Temp:  36.8 C  SpO2: 100% 97%    Last Pain:  Vitals:   06/04/21 1947  TempSrc: Oral  PainSc:                  Foye Deer

## 2021-06-05 ENCOUNTER — Encounter: Payer: Self-pay | Admitting: General Surgery

## 2021-06-05 DIAGNOSIS — K3580 Unspecified acute appendicitis: Secondary | ICD-10-CM | POA: Diagnosis not present

## 2021-06-05 LAB — HEMOGLOBIN A1C
Hgb A1c MFr Bld: 6.1 % — ABNORMAL HIGH (ref 4.8–5.6)
Mean Plasma Glucose: 128.37 mg/dL

## 2021-06-05 LAB — GLUCOSE, CAPILLARY: Glucose-Capillary: 140 mg/dL — ABNORMAL HIGH (ref 70–99)

## 2021-06-05 MED ORDER — IBUPROFEN 600 MG PO TABS: 600.0000 mg | ORAL_TABLET | Freq: Four times a day (QID) | ORAL | 0 refills | Status: DC | PRN

## 2021-06-05 MED ORDER — OXYCODONE HCL 5 MG PO TABS: 5.0000 mg | ORAL_TABLET | ORAL | 0 refills | Status: DC | PRN

## 2021-06-05 MED ORDER — IBUPROFEN 600 MG PO TABS: 600.0000 mg | ORAL_TABLET | Freq: Four times a day (QID) | ORAL | 0 refills | Status: AC | PRN

## 2021-06-05 NOTE — Progress Notes (Signed)
Patient educated on discharge instructions, follow up appointments and medications. Patient verbalized understanding.

## 2021-06-05 NOTE — Discharge Instructions (Signed)
In addition to included general post-operative instructions,  Diet: Resume home diet.   Activity: No heavy lifting >20 pounds (children, pets, laundry, garbage) or strenuous activity until follow-up in 2 weeks, but light activity and walking are encouraged. Do not drive or drink alcohol if taking narcotic pain medications or having pain that might distract from driving.  Wound care: 2 days after surgery (08/23), you may shower/get incision wet with soapy water and pat dry (do not rub incisions), but no baths or submerging incision underwater until follow-up.   Medications: Resume all home medications. For mild to moderate pain: acetaminophen (Tylenol) or ibuprofen/naproxen (if no kidney disease). Combining Tylenol with alcohol can substantially increase your risk of causing liver disease. Narcotic pain medications, if prescribed, can be used for severe pain, though may cause nausea, constipation, and drowsiness. Do not combine Tylenol and Percocet (or similar) within a 6 hour period as Percocet (and similar) contain(s) Tylenol. If you do not need the narcotic pain medication, you do not need to fill the prescription.  Call office [phone] / [phone]) at any time if any questions, worsening pain, fevers/chills, bleeding, drainage from incision site, or other concerns.

## 2021-06-05 NOTE — Discharge Summary (Signed)
[facility] SURGICAL DISCHARGE SUMMARY Patient ID: Vickie Greer MRN: [id number] DOB/AGE: 04-01-1961 60 y.o.  Admit date: 06/04/2021 Discharge date: 06/05/2021  Discharge Diagnoses Patient Active Problem List   Diagnosis Date Noted   Acute appendicitis 06/04/2021    Consultants None  Procedures 06/04/2021:  Laparoscopic appendectomy  HPI: Vickie Greer is an 60 y.o. female who presented to the emergency department with a 24-hour history of abdominal pain and nausea.  Apparently she has had intermittent abdominal pain over the past few months.  She had an abdominal ultrasound that was normal.  She had sudden onset of severe right lower quadrant pain starting about 24 hours ago.  It has been associated with nauseam, but no vomiting.  She does not have an appetite.  She reports subjective low-grade fevers.  The pain is constant and nothing seems to alleviate or exacerbate it.  Evaluation in the emergency department included labs that demonstrated a leukocytosis to 13.3 and a CT scan of the abdomen and pelvis that showed acute appendicitis.  Hospital Course: Informed consent was obtained and documented, and patient underwent uneventful laparoscopic appendectomy (Dr Lady Gary, 06/04/2021).  Post-operatively, patient's pain/symptoms improved/resolved and advancement of patient's diet and ambulation were well-tolerated. The remainder of patient's hospital course was essentially unremarkable, and discharge planning was initiated accordingly with patient safely able to be discharged home with appropriate discharge instructions, pain control, and outpatient follow-up after all of her questions were answered to her expressed satisfaction.   Discharge Condition: Good   Physical Examination:  Constitutional: Well appearing female, NAD Pulmonary: Normal effort, no respiratory distress Gastrointestinal: Soft, non-tender, non-distended, no rebound/guarding Skin: Laparoscopic incisions are  CDI with steri-strips, no erythema or drianage    Allergies as of 06/05/2021       Reactions   Amoxicillin    Clam Shell    Penicillins    Shellfish Allergy Other (See Comments)   Other reaction(s): Abdominal Pain And clams        Medication List     TAKE these medications    buPROPion 150 MG 24 hr tablet Commonly known as: WELLBUTRIN XL Take 150 mg by mouth daily.   busPIRone 10 MG tablet Commonly known as: BUSPAR Take 10 mg by mouth 3 (three) times daily.   glimepiride 2 MG tablet Commonly known as: AMARYL Take 2 mg by mouth daily with breakfast.   ibuprofen 600 MG tablet Commonly known as: ADVIL Take 1 tablet (600 mg total) by mouth every 6 (six) hours as needed. What changed: You were already taking a medication with the same name, and this prescription was added. Make sure you understand how and when to take each.   ibuprofen 600 MG tablet Commonly known as: ADVIL Take 1 tablet (600 mg total) by mouth every 6 (six) hours as needed. What changed: Another medication with the same name was added. Make sure you understand how and when to take each.   magnesium oxide 400 MG tablet Commonly known as: MAG-OX Take 400 mg by mouth daily.   Melatonin 10 MG Tabs Take by mouth.   metFORMIN 500 MG tablet Commonly known as: GLUCOPHAGE Take 500 mg by mouth 2 (two) times daily with a meal.   oxyCODONE 5 MG immediate release tablet Commonly known as: Oxy IR/ROXICODONE Take 1 tablet (5 mg total) by mouth every 4 (four) hours as needed for severe pain or breakthrough pain.   propranolol 10 MG tablet Commonly known as: INDERAL Take 10 mg by mouth 2 (two) times daily.  RA Fish Oil 1000 MG Caps Take 1 capsule by mouth daily.   Replens Gel Place 1 application vaginally 2 (two) times a week.          Follow-up Information     Donovan Kail, PA-C Follow up in 2 week(s).   Specialty: Physician Assistant Why: s/p laparoscopic appendectomy Contact  information: [address] [place] [address] [phone]                  Time spent on discharge management including discussion of hospital course, clinical condition, outpatient instructions, prescriptions, and follow up with the patient and members of the medical team: >30 minutes  -- Lynden Oxford , PA-C [facility]  06/05/2021, 8:04 AM [phone] M-F: 7am - 4pm

## 2021-06-06 LAB — SURGICAL PATHOLOGY

## 2021-06-21 ENCOUNTER — Ambulatory Visit (INDEPENDENT_AMBULATORY_CARE_PROVIDER_SITE_OTHER): Payer: BC Managed Care – PPO | Admitting: Physician Assistant

## 2021-06-21 ENCOUNTER — Encounter: Payer: Self-pay | Admitting: Physician Assistant

## 2021-06-21 ENCOUNTER — Other Ambulatory Visit: Payer: Self-pay

## 2021-06-21 VITALS — BP 151/88 | HR 82 | Temp 98.8°F | Ht 66.0 in | Wt 183.4 lb

## 2021-06-21 DIAGNOSIS — K353 Acute appendicitis with localized peritonitis, without perforation or gangrene: Secondary | ICD-10-CM

## 2021-06-21 DIAGNOSIS — Z09 Encounter for follow-up examination after completed treatment for conditions other than malignant neoplasm: Secondary | ICD-10-CM

## 2021-06-21 NOTE — Patient Instructions (Addendum)
You may take Miralax 1 capful in a full glass of liquid daily to maintain health bowel movements.   You may ride a bike or use the elliptical machine.   Follow-up with our office as needed.  Please call and ask to speak with a nurse if you develop questions or concerns.   GENERAL POST-OPERATIVE PATIENT INSTRUCTIONS   WOUND CARE INSTRUCTIONS: Try to keep the wound dry and avoid ointments on the wound unless directed to do so.  If the wound becomes bright red and painful or starts to drain infected material that is not clear, please contact your physician immediately.  If the wound is mildly pink and has a thick firm ridge underneath it, this is normal, and is referred to as a healing ridge.  This will resolve over the next 4-6 weeks.  BATHING: You may shower if you have been informed of this by your surgeon. However, Please do not submerge in a tub, hot tub, or pool until incisions are completely sealed or have been told by your surgeon that you may do so.  DIET:  You may eat any foods that you can tolerate.  It is a good idea to eat a high fiber diet and take in plenty of fluids to prevent constipation.  If you do become constipated you may want to take a mild laxative or take ducolax tablets on a daily basis until your bowel habits are regular.  Constipation can be very uncomfortable, along with straining, after recent surgery.  ACTIVITY:  You are encouraged to walk and engage in light activity for the next two weeks.  You should not lift more than 20 pounds for 4 weeks after surgery as it could put you at increased risk for complications.  Twenty pounds is roughly equivalent to a plastic bag of groceries. At that time- Listen to your body when lifting, if you have pain when lifting, stop and then try again in a few days. Soreness after doing exercises or activities of daily living is normal as you get back in to your normal routine.  MEDICATIONS:  Try to take narcotic medications and  anti-inflammatory medications, such as tylenol, ibuprofen, naprosyn, etc., with food.  This will minimize stomach upset from the medication.  Should you develop nausea and vomiting from the pain medication, or develop a rash, please discontinue the medication and contact your physician.  You should not drive, make important decisions, or operate machinery when taking narcotic pain medication.  SUNBLOCK Use sun block to incision area over the next year if this area will be exposed to sun. This helps decrease scarring and will allow you avoid a permanent darkened area over your incision.  QUESTIONS:  Please feel free to call our office if you have any questions, and we will be glad to assist you.

## 2021-06-21 NOTE — Progress Notes (Signed)
[facility] POST-OP OFFICE VISIT  06/21/2021  HPI: Vickie Greer is a 60 y.o. female 17 days s/p laparoscopic appendectomy for acute appendicitis with Dr Lady Gary  Overall doing well, but still "does not feel 100%." She has a few intermittent "twinges" of pain in her right lower quadrant but these are very mild and resolve spontaneously. She ids not taking anything for pain at the moment. No fever, chills, nausea, emesis, or bowel changes. zNo issues with the incision. She has gone back to work. No other complaints.   Vital signs: BP (!) 151/88   Pulse 82   Temp 98.8 F (37.1 C)   Ht 5\' 6"  (1.676 m)   Wt 183 lb 6.4 oz (83.2 kg)   SpO2 99%   BMI 29.60 kg/m    Physical Exam: Constitutional: Well appearing female, NAD Abdomen: Soft, non-tender, non-distended, no rebound/guarding Skin: Laparoscopic incisions have healed well, no erythema or drainage   Assessment/Plan: This is a 60 y.o. female 17 days s/p laparoscopic appendectomy for acute appendicitis    - Pain control prn  - Reviewed lifting restrictions; 4 weeks total; okay for light activity   - Reviewed wound care  - Reviewed surgical pathology: Acute Appendicitis; Negative for malignancy   - She can rtc on as needed basis  -- 47, PA-C [facility] 06/21/2021, 11:04 AM [phone] M-F: 7am - 4pm

## 2021-06-23 ENCOUNTER — Encounter: Payer: BC Managed Care – PPO | Admitting: Physician Assistant

## 2021-07-24 ENCOUNTER — Encounter: Payer: Self-pay | Admitting: General Surgery

## 2021-08-10 ENCOUNTER — Ambulatory Visit: Payer: BC Managed Care – PPO | Admitting: Obstetrics & Gynecology

## 2021-09-03 ENCOUNTER — Other Ambulatory Visit: Payer: Self-pay

## 2021-09-03 ENCOUNTER — Ambulatory Visit
Admission: EM | Admit: 2021-09-03 | Discharge: 2021-09-03 | Disposition: A | Discharge status: Home / Self Care | Payer: BC Managed Care – PPO | Attending: Emergency Medicine | Admitting: Emergency Medicine

## 2021-09-03 DIAGNOSIS — U071 COVID-19: Secondary | ICD-10-CM | POA: Diagnosis not present

## 2021-09-03 HISTORY — DX: COVID-19: U07.1

## 2021-09-03 MED ORDER — MOLNUPIRAVIR EUA 200MG CAPSULE: 4.0000 | ORAL_CAPSULE | Freq: Two times a day (BID) | ORAL | 0 refills | Status: AC

## 2021-09-03 NOTE — ED Triage Notes (Signed)
Pt states that she has a fever, body aches, nasal congestion and cough. X2 days  Pt states that she had a positive covid test 11/20.  Pt isn't vaccinated for covid.

## 2021-09-03 NOTE — Discharge Instructions (Addendum)
You should self-quarantine according to the CDC guidelines.  See attached.    Take the molnupiravir as directed.    Go to the emergency department if you have high fever, shortness of breath, severe diarrhea, or other concerning symptoms.     

## 2021-09-03 NOTE — ED Provider Notes (Signed)
[name]    CSN: [id number] Arrival date & time: 09/03/21  8242      History   Chief Complaint Chief Complaint  Patient presents with   Fever    Pt states that she has a fever, body aches, nasal congestion and cough. X2 days.     HPI Vickie Greer is a 60 y.o. female.  Patient presents with fever, body aches, congestion, cough since yesterday.  Positive COVID test at home today.  Treatment at home with Tylenol and cough drops; last dose of Tylenol 8 hours PTA.  She denies ear pain, sore throat, shortness of breath, vomiting, diarrhea, or other symptoms.  Her medical history includes diabetes and hypertension.  The history is provided by the patient and medical records.   Past Medical History:  Diagnosis Date   COVID-19    Diabetes mellitus without complication (HCC)    Hypertension     Patient Active Problem List   Diagnosis Date Noted   Acute appendicitis 06/04/2021   Annual physical exam 01/02/2018   Vaginal atrophy 01/02/2018    Past Surgical History:  Procedure Laterality Date   CATARACT EXTRACTION Right    CHOLECYSTECTOMY     DILATION AND CURETTAGE OF UTERUS     LAPAROSCOPIC APPENDECTOMY N/A 06/04/2021   Procedure: APPENDECTOMY LAPAROSCOPIC;  Surgeon: Duanne Guess, MD;  Location: [facility] ORS;  Service: General;  Laterality: N/A;   TUBAL LIGATION     UTERINE FIBROID EMBOLIZATION      OB History     Gravida  4   Para  3   Term  3   Preterm      AB  1   Living  3      SAB      IAB      Ectopic      Multiple      Live Births               Home Medications    Prior to Admission medications   Medication Sig Start Date End Date Taking? Authorizing Provider  busPIRone (BUSPAR) 10 MG tablet Take 10 mg by mouth 3 (three) times daily. 05/23/21  Yes [provider]  glimepiride (AMARYL) 2 MG tablet Take 2 mg by mouth daily with breakfast. 05/10/21  Yes [provider]  ibuprofen (ADVIL) 600 MG tablet Take 1  tablet (600 mg total) by mouth every 6 (six) hours as needed. 06/05/21  Yes Lynden Oxford R, PA-C  magnesium oxide (MAG-OX) 400 MG tablet Take 400 mg by mouth daily.   Yes [provider]  Melatonin 10 MG TABS Take by mouth.   Yes [provider]  molnupiravir EUA (LAGEVRIO) 200 mg CAPS capsule Take 4 capsules (800 mg total) by mouth 2 (two) times daily for 5 days. 09/03/21 09/08/21 Yes Mickie Bail, NP  Omega-3 Fatty Acids (RA FISH OIL) 1000 MG CAPS Take 1 capsule by mouth daily.   Yes [provider]  propranolol (INDERAL) 10 MG tablet Take 10 mg by mouth 2 (two) times daily. 11/16/20 11/16/21 Yes [provider]    Family History Family History  Problem Relation Age of Onset   Breast cancer Mother 90   Hypertension Father    Hypoparathyroidism Sister    Stomach cancer Paternal Grandmother    Breast cancer Paternal Aunt 87   Breast cancer Paternal Aunt 28    Social History Social History   Tobacco Use   Smoking status: Never   Smokeless  tobacco: Never  Vaping Use   Vaping Use: Never used  Substance Use Topics   Alcohol use: Never   Drug use: Never    Comment: prescribed xanax     Allergies   Amoxicillin, Clam shell, Penicillins, and Shellfish allergy   Review of Systems Review of Systems  Constitutional:  Positive for fever. Negative for chills.  HENT:  Positive for congestion. Negative for ear pain and sore throat.   Respiratory:  Positive for cough. Negative for shortness of breath.   Cardiovascular:  Negative for chest pain and palpitations.  Gastrointestinal:  Negative for diarrhea and vomiting.  Skin:  Negative for color change and rash.  All other systems reviewed and are negative.   Physical Exam Triage Vital Signs ED Triage Vitals  Enc Vitals Group     BP 09/03/21 0848 108/71     Pulse Rate 09/03/21 0848 100     Resp 09/03/21 0848 18     Temp 09/03/21 0848 99.5 F (37.5 C)     Temp Source 09/03/21 0848 Oral      SpO2 09/03/21 0848 96 %     Weight 09/03/21 0845 182 lb (82.6 kg)     Height 09/03/21 0845 5\' 6"  (1.676 m)     Head Circumference --      Peak Flow --      Pain Score 09/03/21 0845 4     Pain Loc --      Pain Edu? --      Excl. in [place]? --    No data found.  Updated Vital Signs BP 108/71 (BP Location: Left Arm)   Pulse 100   Temp 99.5 F (37.5 C) (Oral)   Resp 18   Ht 5\' 6"  (1.676 m)   Wt 182 lb (82.6 kg)   SpO2 96%   BMI 29.38 kg/m   Visual Acuity Right Eye Distance:   Left Eye Distance:   Bilateral Distance:    Right Eye Near:   Left Eye Near:    Bilateral Near:     Physical Exam Vitals and nursing note reviewed.  Constitutional:      General: She is not in acute distress.    Appearance: She is well-developed. She is not ill-appearing.  HENT:     Head: Normocephalic and atraumatic.     Right Ear: Tympanic membrane normal.     Left Ear: Tympanic membrane normal.     Nose: Nose normal.     Mouth/Throat:     Mouth: Mucous membranes are moist.     Pharynx: Oropharynx is clear.  Cardiovascular:     Rate and Rhythm: Normal rate and regular rhythm.     Heart sounds: Normal heart sounds.  Pulmonary:     Effort: Pulmonary effort is normal. No respiratory distress.     Breath sounds: Normal breath sounds.  Abdominal:     Palpations: Abdomen is soft.     Tenderness: There is no abdominal tenderness.  Musculoskeletal:     Cervical back: Neck supple.  Skin:    General: Skin is warm and dry.  Neurological:     Mental Status: She is alert.  Psychiatric:        Mood and Affect: Mood normal.        Behavior: Behavior normal.     UC Treatments / Results  Labs (all labs ordered are listed, but only abnormal results are displayed) Labs Reviewed  NOVEL CORONAVIRUS, NAA    EKG   Radiology No results  found.  Procedures Procedures (including critical care time)  Medications Ordered in UC Medications - No data to display  Initial Impression / Assessment  and Plan / UC Course  I have reviewed the triage vital signs and the nursing notes.  Pertinent labs & imaging results that were available during my care of the patient were reviewed by me and considered in my medical decision making (see chart for details).  COVID-19.  Patient symptoms started yesterday.  She tested positive for COVID this morning.  Discussed treatment options.  Patient prefers to take molnupiravir.  Discussed side effects.  Instructed patient to continue symptomatic treatment at home also.  ED precautions discussed.  Instructed her to follow-up with her PCP via telephone tomorrow to let them know that she has COVID and is taking molnupiravir.  She agrees to plan of care.   Final Clinical Impressions(s) / UC Diagnoses   Final diagnoses:  [id number]     Discharge Instructions      You should self-quarantine according to the CDC guidelines.  See attached.    Take the molnupiravir as directed.  Go to the emergency department if you have high fever, shortness of breath, severe diarrhea, or other concerning symptoms.        ED Prescriptions     Medication Sig Dispense Auth. Provider   molnupiravir EUA (LAGEVRIO) 200 mg CAPS capsule Take 4 capsules (800 mg total) by mouth 2 (two) times daily for 5 days. 40 capsule Sharion Balloon, NP      PDMP not reviewed this encounter.   Sharion Balloon, NP 09/03/21 [phone]

## 2021-09-04 LAB — NOVEL CORONAVIRUS, NAA: SARS-CoV-2, NAA: DETECTED — AB

## 2021-09-04 LAB — SARS-COV-2, NAA 2 DAY TAT

## 2021-09-11 ENCOUNTER — Other Ambulatory Visit: Payer: Self-pay | Admitting: Internal Medicine

## 2021-09-11 DIAGNOSIS — Z1231 Encounter for screening mammogram for malignant neoplasm of breast: Secondary | ICD-10-CM

## 2021-09-12 ENCOUNTER — Other Ambulatory Visit: Payer: Self-pay

## 2021-09-12 ENCOUNTER — Other Ambulatory Visit (HOSPITAL_COMMUNITY)
Admission: RE | Admit: 2021-09-12 | Discharge: 2021-09-12 | Disposition: A | Discharge status: Home / Self Care | Payer: BC Managed Care – PPO | Source: Ambulatory Visit | Attending: Obstetrics & Gynecology | Admitting: Obstetrics & Gynecology

## 2021-09-12 ENCOUNTER — Encounter: Payer: Self-pay | Admitting: Obstetrics & Gynecology

## 2021-09-12 ENCOUNTER — Ambulatory Visit (INDEPENDENT_AMBULATORY_CARE_PROVIDER_SITE_OTHER): Payer: BC Managed Care – PPO | Admitting: Obstetrics & Gynecology

## 2021-09-12 VITALS — BP 128/80 | Ht 66.0 in | Wt 181.0 lb

## 2021-09-12 DIAGNOSIS — Z124 Encounter for screening for malignant neoplasm of cervix: Secondary | ICD-10-CM

## 2021-09-12 DIAGNOSIS — Z01419 Encounter for gynecological examination (general) (routine) without abnormal findings: Secondary | ICD-10-CM

## 2021-09-12 DIAGNOSIS — J0110 Acute frontal sinusitis, unspecified: Secondary | ICD-10-CM

## 2021-09-12 MED ORDER — AZITHROMYCIN 250 MG PO TABS: ORAL_TABLET | ORAL | 0 refills | Status: AC

## 2021-09-12 NOTE — Patient Instructions (Signed)
PAP every three years Mammogram every year    Call [phone] to schedule at [place] Colonoscopy every 10 years Labs yearly (with PCP)  Thank you for choosing [facility]. As part of our ongoing efforts to improve patient experience, we would appreciate your feedback. Please fill out the short survey that you will receive by mail or MyChart. Your opinion is important to us! - Dr. Dionisios Ricci  Recommendations to boost your immunity to prevent illness such as viral flu and colds, including covid19, are as follows:       - - -  Vitamin K2 and Vitamin D3  - - - Take Vitamin K2 at 200-300 mcg daily (usually 2-3 pills daily of the over the counter formulation). Take Vitamin D3 at 3000-4000 U daily (usually 3-4 pills daily of the over the counter formulation). Studies show that these two at high normal levels in your system are very effective in keeping your immunity so strong and protective that you will be unlikely to contract viral illness such as those listed above.  Dr Emmeline Winebarger  

## 2021-09-12 NOTE — Progress Notes (Signed)
HPI:      [name] is a 60 y.o. [phone] who LMP was in the past, she presents today for her annual examination.  The patient has no complaints today; does not have problems so much w vag dryness any longer.  Denies LOU or GU concerns.  Does have persistent sinusitis right now, she has taken one round of Zpack, no relief, also Mucinex.  Had Covid 2 weeks ago as well.  The patient is sexually active. Herlast pap: approximate date 2019 and was normal and last mammogram: approximate date 2021 and was normal.  The patient does perform self breast exams.  There is no notable family history of breast or ovarian cancer in her family. The patient is not taking hormone replacement therapy. Patient denies post-menopausal vaginal bleeding.   The patient has regular exercise: yes. The patient denies current symptoms of depression.    GYN Hx: Last Colonoscopy:1 year ago. Normal.  Last DEXA:  never  ago.    PMHx: Past Medical History:  Diagnosis Date   COVID-19    Diabetes mellitus without complication (HCC)    Hypertension    Past Surgical History:  Procedure Laterality Date   CATARACT EXTRACTION Right    CHOLECYSTECTOMY     DILATION AND CURETTAGE OF UTERUS     LAPAROSCOPIC APPENDECTOMY N/A 06/04/2021   Procedure: APPENDECTOMY LAPAROSCOPIC;  Surgeon: Duanne Guess, MD;  Location: [facility] ORS;  Service: General;  Laterality: N/A;   TUBAL LIGATION     UTERINE FIBROID EMBOLIZATION     Family History  Problem Relation Age of Onset   Breast cancer Mother 55   Hypertension Father    Hypoparathyroidism Sister    Stomach cancer Paternal Grandmother    Breast cancer Paternal Aunt 47   Breast cancer Paternal Aunt 36   Social History   Tobacco Use   Smoking status: Never   Smokeless tobacco: Never  Vaping Use   Vaping Use: Never used  Substance Use Topics   Alcohol use: Never   Drug use: Never    Comment: prescribed xanax    Current Outpatient Medications:    azithromycin  (ZITHROMAX) 250 MG tablet, Take po as directed, Disp: 6 tablet, Rfl: 0   busPIRone (BUSPAR) 10 MG tablet, Take 10 mg by mouth 3 (three) times daily., Disp: , Rfl:    glimepiride (AMARYL) 2 MG tablet, Take 2 mg by mouth daily with breakfast., Disp: , Rfl:    ibuprofen (ADVIL) 600 MG tablet, Take 1 tablet (600 mg total) by mouth every 6 (six) hours as needed., Disp: 30 tablet, Rfl: 0   magnesium oxide (MAG-OX) 400 MG tablet, Take 400 mg by mouth daily., Disp: , Rfl:    Melatonin 10 MG TABS, Take by mouth., Disp: , Rfl:    Omega-3 Fatty Acids (RA FISH OIL) 1000 MG CAPS, Take 1 capsule by mouth daily., Disp: , Rfl:    propranolol (INDERAL) 10 MG tablet, Take 10 mg by mouth 2 (two) times daily., Disp: , Rfl:  Allergies: Amoxicillin, Clam shell, Penicillins, and Shellfish allergy  Review of Systems  Constitutional:  Negative for chills, fever and malaise/fatigue.  HENT:  Positive for congestion and sinus pain. Negative for sore throat.   Eyes:  Negative for blurred vision and pain.  Respiratory:  Negative for cough and wheezing.   Cardiovascular:  Negative for chest pain and leg swelling.  Gastrointestinal:  Negative for abdominal pain, constipation, diarrhea, heartburn, nausea and vomiting.  Genitourinary:  Negative for dysuria, frequency,  hematuria and urgency.  Musculoskeletal:  Negative for back pain, joint pain, myalgias and neck pain.  Skin:  Negative for itching and rash.  Neurological:  Negative for dizziness, tremors and weakness.  Endo/Heme/Allergies:  Does not bruise/bleed easily.  Psychiatric/Behavioral:  Negative for depression. The patient is not nervous/anxious and does not have insomnia.    Objective: BP 128/80   Ht 5\' 6"  (1.676 m)   Wt 181 lb (82.1 kg)   BMI 29.21 kg/m   Filed Weights   09/12/21 1421  Weight: 181 lb (82.1 kg)   Body mass index is 29.21 kg/m. Physical Exam Constitutional:      General: She is not in acute distress.    Appearance: She is well-developed.   Genitourinary:     Bladder, rectum and urethral meatus normal.     No lesions in the vagina.     Right Labia: No rash, tenderness or lesions.    Left Labia: No tenderness, lesions or rash.    No vaginal bleeding.      Right Adnexa: not tender and no mass present.    Left Adnexa: not tender and no mass present.    No cervical motion tenderness, friability, lesion or polyp.     Uterus is not enlarged.     No uterine mass detected.    Pelvic exam was performed with patient in the lithotomy position.  Breasts:    Right: No mass, skin change or tenderness.     Left: No mass, skin change or tenderness.  HENT:     Head: Normocephalic and atraumatic. No laceration.     Right Ear: Hearing normal.     Left Ear: Hearing normal.     Mouth/Throat:     Pharynx: Uvula midline.  Eyes:     Pupils: Pupils are equal, round, and reactive to light.  Neck:     Thyroid: No thyromegaly.  Cardiovascular:     Rate and Rhythm: Normal rate and regular rhythm.     Heart sounds: No murmur heard.   No friction rub. No gallop.  Pulmonary:     Effort: Pulmonary effort is normal. No respiratory distress.     Breath sounds: Normal breath sounds. No wheezing.  Abdominal:     General: Bowel sounds are normal. There is no distension.     Palpations: Abdomen is soft.     Tenderness: There is no abdominal tenderness. There is no rebound.  Musculoskeletal:        General: Normal range of motion.     Cervical back: Normal range of motion and neck supple.  Neurological:     Mental Status: She is alert and oriented to person, place, and time.     Cranial Nerves: No cranial nerve deficit.  Skin:    General: Skin is warm and dry.  Psychiatric:        Judgment: Judgment normal.  Vitals reviewed.    Assessment: Annual Exam 1. Women's annual routine gynecological examination   2. Screening for cervical cancer   3. Acute non-recurrent frontal sinusitis     Plan:            1.  Cervical Screening-  Pap  smear done today  2. Breast screening- Exam annually and mammogram scheduled  3. Colonoscopy every 10 years, Hemoccult testing after age 3  4. Labs managed by PCP  5. Counseling for hormonal therapy: none              6. FRAX - FRAX  score for assessing the 10 year probability for fracture calculated and discussed today.  Based on age and score today, DEXA is not currently scheduled.   7. Sinusitis, cont to see PCP if does not resolve.  ZPack refill called in.  Change to Sudafed.     F/U  Return in about 1 year (around 09/12/2022) for Annual.  Barnett Applebaum, MD, [facility], [facility] 09/12/2021  3:02 PM

## 2021-09-14 LAB — CYTOLOGY - PAP
Comment: NEGATIVE
Diagnosis: NEGATIVE
High risk HPV: NEGATIVE

## 2021-10-04 ENCOUNTER — Other Ambulatory Visit: Payer: Self-pay

## 2021-10-04 ENCOUNTER — Ambulatory Visit
Admission: RE | Admit: 2021-10-04 | Discharge: 2021-10-04 | Disposition: A | Discharge status: Home / Self Care | Payer: BC Managed Care – PPO | Source: Ambulatory Visit | Attending: Internal Medicine | Admitting: Internal Medicine

## 2021-10-04 DIAGNOSIS — Z1231 Encounter for screening mammogram for malignant neoplasm of breast: Secondary | ICD-10-CM

## 2021-12-14 ENCOUNTER — Ambulatory Visit: Payer: BC Managed Care – PPO | Admitting: Obstetrics & Gynecology

## 2022-10-02 ENCOUNTER — Other Ambulatory Visit: Payer: Self-pay | Admitting: Internal Medicine

## 2022-10-02 DIAGNOSIS — Z1231 Encounter for screening mammogram for malignant neoplasm of breast: Secondary | ICD-10-CM

## 2022-10-05 ENCOUNTER — Ambulatory Visit
Admission: RE | Admit: 2022-10-05 | Discharge: 2022-10-05 | Disposition: A | Discharge status: Home / Self Care | Payer: BC Managed Care – PPO | Source: Ambulatory Visit | Attending: Internal Medicine | Admitting: Internal Medicine

## 2022-10-05 DIAGNOSIS — Z1231 Encounter for screening mammogram for malignant neoplasm of breast: Secondary | ICD-10-CM | POA: Insufficient documentation

## 2023-07-15 ENCOUNTER — Ambulatory Visit: Payer: BC Managed Care – PPO

## 2023-07-15 DIAGNOSIS — Z86010 Personal history of colonic polyps: Secondary | ICD-10-CM | POA: Diagnosis not present

## 2023-07-15 DIAGNOSIS — K640 First degree hemorrhoids: Secondary | ICD-10-CM | POA: Diagnosis not present

## 2023-07-15 DIAGNOSIS — Z09 Encounter for follow-up examination after completed treatment for conditions other than malignant neoplasm: Secondary | ICD-10-CM | POA: Diagnosis present

## 2023-07-15 DIAGNOSIS — K5730 Diverticulosis of large intestine without perforation or abscess without bleeding: Secondary | ICD-10-CM | POA: Diagnosis not present
